# Patient Record
Sex: Female | Born: 1940 | Race: White | Hispanic: No | Marital: Married | Smoking: Never smoker
Health system: Southern US, Community
[De-identification: ages and names within clinical notes are randomized; demographics above are authoritative.]

## PROBLEM LIST (undated history)

## (undated) DIAGNOSIS — I87309 Chronic venous hypertension (idiopathic) without complications of unspecified lower extremity: Secondary | ICD-10-CM

## (undated) DIAGNOSIS — Z9889 Other specified postprocedural states: Secondary | ICD-10-CM

## (undated) DIAGNOSIS — T8859XA Other complications of anesthesia, initial encounter: Secondary | ICD-10-CM

## (undated) DIAGNOSIS — G479 Sleep disorder, unspecified: Secondary | ICD-10-CM

## (undated) DIAGNOSIS — E785 Hyperlipidemia, unspecified: Secondary | ICD-10-CM

## (undated) DIAGNOSIS — M1990 Unspecified osteoarthritis, unspecified site: Secondary | ICD-10-CM

## (undated) DIAGNOSIS — M797 Fibromyalgia: Secondary | ICD-10-CM

## (undated) DIAGNOSIS — T4145XA Adverse effect of unspecified anesthetic, initial encounter: Secondary | ICD-10-CM

## (undated) DIAGNOSIS — C801 Malignant (primary) neoplasm, unspecified: Secondary | ICD-10-CM

## (undated) DIAGNOSIS — I739 Peripheral vascular disease, unspecified: Secondary | ICD-10-CM

## (undated) DIAGNOSIS — R7303 Prediabetes: Secondary | ICD-10-CM

## (undated) DIAGNOSIS — K219 Gastro-esophageal reflux disease without esophagitis: Secondary | ICD-10-CM

## (undated) DIAGNOSIS — K589 Irritable bowel syndrome without diarrhea: Secondary | ICD-10-CM

## (undated) DIAGNOSIS — Z9289 Personal history of other medical treatment: Secondary | ICD-10-CM

## (undated) DIAGNOSIS — R112 Nausea with vomiting, unspecified: Secondary | ICD-10-CM

## (undated) HISTORY — DX: Irritable bowel syndrome, unspecified: K58.9

## (undated) HISTORY — PX: CHOLECYSTECTOMY: SHX55

## (undated) HISTORY — PX: ENDOVENOUS ABLATION SAPHENOUS VEIN W/ LASER: SUR449

## (undated) HISTORY — DX: Chronic venous hypertension (idiopathic) without complications of unspecified lower extremity: I87.309

## (undated) HISTORY — PX: HERNIA REPAIR: SHX51

## (undated) HISTORY — PX: ABDOMINAL HYSTERECTOMY: SHX81

## (undated) HISTORY — PX: OTHER SURGICAL HISTORY: SHX169

## (undated) HISTORY — PX: SHOULDER ARTHROSCOPY: SHX128

## (undated) HISTORY — DX: Prediabetes: R73.03

## (undated) HISTORY — PX: COLONOSCOPY: SHX174

## (undated) HISTORY — PX: TONSILLECTOMY: SUR1361

## (undated) HISTORY — DX: Hyperlipidemia, unspecified: E78.5

## (undated) HISTORY — PX: BREAST ENHANCEMENT SURGERY: SHX7

## (undated) HISTORY — DX: Gastro-esophageal reflux disease without esophagitis: K21.9

---

## 2006-03-26 HISTORY — PX: BLADDER SUSPENSION: SHX72

## 2008-04-16 ENCOUNTER — Emergency Department (HOSPITAL_BASED_OUTPATIENT_CLINIC_OR_DEPARTMENT_OTHER): Admission: EM | Admit: 2008-04-16 | Discharge: 2008-04-16 | Payer: Self-pay | Admitting: Emergency Medicine

## 2008-04-16 ENCOUNTER — Ambulatory Visit: Payer: Self-pay | Admitting: Diagnostic Radiology

## 2008-04-17 ENCOUNTER — Emergency Department (HOSPITAL_BASED_OUTPATIENT_CLINIC_OR_DEPARTMENT_OTHER): Admission: EM | Admit: 2008-04-17 | Discharge: 2008-04-17 | Payer: Self-pay | Admitting: Emergency Medicine

## 2008-06-01 ENCOUNTER — Encounter: Admission: RE | Admit: 2008-06-01 | Discharge: 2008-08-16 | Payer: Self-pay | Admitting: Orthopedic Surgery

## 2010-09-06 ENCOUNTER — Encounter (INDEPENDENT_AMBULATORY_CARE_PROVIDER_SITE_OTHER): Payer: Medicare Other

## 2010-09-06 ENCOUNTER — Encounter (INDEPENDENT_AMBULATORY_CARE_PROVIDER_SITE_OTHER): Payer: Medicare Other | Admitting: Vascular Surgery

## 2010-09-06 DIAGNOSIS — I83893 Varicose veins of bilateral lower extremities with other complications: Secondary | ICD-10-CM

## 2010-09-06 DIAGNOSIS — M7989 Other specified soft tissue disorders: Secondary | ICD-10-CM

## 2010-09-06 NOTE — Consult Note (Signed)
NEW PATIENT CONSULTATION  Hawkins, Dominique B DOB:  Jan 19, 1941                                       09/06/2010 KXFGH#:[id number]  Patient presents today for discussion regarding bilateral extremity venous hypertension.  She is an active healthy 70 year old white female with bilateral difficulty regarding lower extremity venous pathology. 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  She also has difficulty in her left leg with varices over her pretibial area and also has had multiple episodes of bleeding from telangiectasia over her left lateral calf above the ankle.  She does not have any history of DVT.  She does not have a great deal of pain over the varicosities in her left leg.  PAST MEDICAL HISTORY:  Significant for excellent health but no cardiac difficulty.  PREVIOUS SURGERY:  Cholecystectomy, partial hysterectomy.  SOCIAL HISTORY:  She is married with 2 children.  She does not smoke or drink alcohol.  FAMILY HISTORY:  Negative for premature atherosclerotic disease.  REVIEW OF SYSTEMS:  No weight loss or gain.  She weighs 154 pounds.  She is 5 feet 2 inches tall. VASCULAR:  Positive for pain and is described with walking. GI:  For irritable bowel syndrome and reflux, otherwise review of systems is negative.  PHYSICAL EXAMINATION:  A well-developed and well-nourished white female appearing stated age in no acute distress.  Blood pressure is 168/84, pulse 72, respirations 16-18.  HEENT is normal.  She has 2+ radial and 2+ dorsalis pedis pulses bilaterally.  Musculoskeletal shows no major deformities or cyanosis.  Neurologic:  No focal weakness or paresthesias.  Skin without ulcers or rashes.  She does have very prominent telangiectasia and this large plexus of the medial knee on  the right and also prominent spider veins over the left lateral area above her ankle.  She underwent noninvasive vascular laboratory studies in our office, and this revealed reflux in her saphenous veins bilaterally.  She does not have any significant reflux in her deep veins.  I have discussed the significance of this with patient.  I explained that this is not life- or limb-threatening, and it could be treated conservatively.  She has not worn compression garments; therefore, we have fitted her today with thigh-high graduated compression garments, 20 mmHg to 30 mmHg, and have discussed the use of these.  We will see her again in 3 months for a continued discussion.  I do feel that she would be a candidate for improvement of her symptoms should conservative therapy fail on the right.  She would be a good candidate for laser ablation of her saphenous vein and sclerotherapy of these painful telangiectasia on the left.  I would recommend initially treating with injection of the telangiectasia only to hopefully prevent recurrent bleeding from these areas.  She understands and will see [place] again in 3 months for a continued discussion.    Larina Earthly, M.D. Electronically Signed  TFE/MEDQ  D:  09/06/2010  T:  09/06/2010  Job:  [id number]  cc:   Raynelle Jan, M.D.

## 2010-09-11 NOTE — Procedures (Unsigned)
LOWER EXTREMITY VENOUS REFLUX EXAM  INDICATION:  Varicose veins.  EXAM:  Using color-flow imaging and pulse Doppler spectral analysis, the bilateral common femoral, superficial femoral, popliteal, posterior tibial, greater and lesser saphenous veins are evaluated.  There is evidence of deep venous reflux noted focally in the right common femoral vein.  The bilateral saphenofemoral junctions are competent.  The bilateral nontortuous GSV's demonstrate reflux of >576milliseconds.  The bilateral proximal short saphenous vein demonstrates competency.  GSV Diameter (used if found to be incompetent only)                                           Right    Left Proximal Greater Saphenous Vein           0.41 cm  0.52 cm Proximal-to-mid-thigh                     cm       cm Mid thigh                                 0.38 cm  0.37 cm Mid-distal thigh                          cm       cm Distal thigh                              0.42 cm  0.33 cm Knee                                      0.45 cm  0.32 cm  IMPRESSION: 1. Bilateral greater saphenous vein and right common femoral vein     reflux is noted, as described above. 2. The bilateral small saphenous veins are competent.           ___________________________________________ Larina Earthly, M.D.  CH/MEDQ  D:  09/06/2010  T:  09/06/2010  Job:  [id number]

## 2010-11-14 ENCOUNTER — Encounter: Payer: Self-pay | Admitting: Vascular Surgery

## 2010-11-16 ENCOUNTER — Encounter: Payer: Self-pay | Admitting: Vascular Surgery

## 2010-12-12 ENCOUNTER — Encounter: Payer: Self-pay | Admitting: Vascular Surgery

## 2010-12-13 ENCOUNTER — Ambulatory Visit (INDEPENDENT_AMBULATORY_CARE_PROVIDER_SITE_OTHER): Payer: Medicare Other | Admitting: Vascular Surgery

## 2010-12-13 ENCOUNTER — Encounter: Payer: Self-pay | Admitting: Vascular Surgery

## 2010-12-13 VITALS — BP 115/70 | HR 73 | Resp 16 | Ht 62.0 in | Wt 151.0 lb

## 2010-12-13 DIAGNOSIS — I83893 Varicose veins of bilateral lower extremities with other complications: Secondary | ICD-10-CM

## 2010-12-13 NOTE — Progress Notes (Signed)
Problems with Activities of Daily Living Secondary to Leg Pain  1. Dominique Hawkins states she has had to discontinue walking for exercise due to leg pain.  2. Dominique Hawkins states that any activities that require prolonged standing such as cooking, cleaning, and shopping are very difficult due to leg pain.  Rankin, [name]   Failure of  Conservative Therapy:  1. Worn 20-30 mm Hg thigh high compression hose >3 months with no relief of symptoms.  2. Frequently elevates legs-no relief of symptoms  3. Taken Ibuprofen 600 Mg TID with no relief of symptoms.  The patient tends to have pain bilaterally more so on the right leg than on the left despite graduated compression garments. She has large nest of raised telangiectasia of the medial aspect of her right knee and reports discomfort this with prolonged standing and also lying in bed with one detaching the other. I have discussed treatment to include right great saphenous vein ablation and sclerotherapy of the painful telangiectasia. She understands this and wished to proceed says she can be on the schedule.

## 2010-12-21 ENCOUNTER — Other Ambulatory Visit: Payer: Self-pay | Admitting: *Deleted

## 2010-12-21 DIAGNOSIS — I83893 Varicose veins of bilateral lower extremities with other complications: Secondary | ICD-10-CM

## 2011-02-07 ENCOUNTER — Encounter: Payer: Self-pay | Admitting: Vascular Surgery

## 2011-02-08 ENCOUNTER — Ambulatory Visit (INDEPENDENT_AMBULATORY_CARE_PROVIDER_SITE_OTHER): Payer: Medicare Other | Admitting: Vascular Surgery

## 2011-02-08 ENCOUNTER — Encounter: Payer: Self-pay | Admitting: Vascular Surgery

## 2011-02-08 VITALS — BP 128/65 | HR 78 | Resp 18 | Ht 62.0 in | Wt 153.0 lb

## 2011-02-08 DIAGNOSIS — I83893 Varicose veins of bilateral lower extremities with other complications: Secondary | ICD-10-CM

## 2011-02-08 NOTE — Progress Notes (Signed)
Laser Ablation Procedure      Date: 02/08/2011    Dominique Hawkins DOB:06/18/1940  Consent signed: Yes  Surgeon:T.F. Early  Procedure: Laser Ablation: right Greater Saphenous Vein  BP 128/65  Pulse 78  Resp 18  Ht 5\' 2"  (1.575 m)  Wt 153 lb (69.4 kg)  BMI 27.98 kg/m2  Start time: 10:40AM   End time: 11:55AM  Tumescent Anesthesia: 425 cc 0.9% NaCl with 50 cc Lidocaine HCL with 1% Epi and 15 cc 8.4% NaHCO3  Local Anesthesia: 4 cc Lidocaine HCL and NaHCO3 (ratio 2:1)  Continuous Mode: 15 watts [employer] 1737 joules Total Time1:55   Sclerotherapy: 0.3 %Sotradecol. Patient received a total of 3 cc    Patient tolerated procedure well: Yes  Rankin, [name]  Description of Procedure:  After marking the course of the saphenous vein and the secondary varicosities in the standing position, the patient was placed on the operating table in the supine position, and the right leg was prepped and draped in sterile fashion. Local anesthetic was administered, and under ultrasound guidance the saphenous vein was accessed with a micro needle and guide wire; then the micro puncture sheath was placed. A guide wire was inserted to the saphenofemoral junction, followed by a 5 french sheath.  The position of the sheath and then the laser fiber below the junction was confirmed using the ultrasound and visualization of the aiming beam.  Tumescent anesthesia was administered along the course of the saphenous vein using ultrasound guidance. Protective laser glasses were placed on the patient, and the laser was fired at at 15 watt continuous mode.  For a total of 1737 joules.  A steri strip was applied to the puncture site.   Sclerotherapy was performed to 4 perforator vessels using 3  cc .3% Sotradecol foam via a 30 gauge needle.  ABD pads and thigh high compression stockings were applied.  Ace wrap bandages were appliedat the top of the saphenofemoral junction.  Blood loss was less than 15 cc.  The  patient ambulated out of the operating room having tolerated the procedure well.  The patient had uneventful laser ablation of her right great saphenous vein. She also had sclerotherapy to prominent telangiectasia in the right medial knee and left ankle area where she had prior bleeding. She will be seen again in one week for followup.

## 2011-02-09 ENCOUNTER — Encounter: Payer: Self-pay | Admitting: Vascular Surgery

## 2011-02-12 ENCOUNTER — Ambulatory Visit: Payer: Medicare Other | Admitting: Vascular Surgery

## 2011-02-12 ENCOUNTER — Telehealth: Payer: Self-pay | Admitting: *Deleted

## 2011-02-12 ENCOUNTER — Encounter: Payer: Self-pay | Admitting: Vascular Surgery

## 2011-02-12 ENCOUNTER — Other Ambulatory Visit (INDEPENDENT_AMBULATORY_CARE_PROVIDER_SITE_OTHER): Payer: Medicare Other | Admitting: *Deleted

## 2011-02-12 VITALS — BP 99/75 | HR 74 | Resp 18 | Ht 62.0 in | Wt 153.0 lb

## 2011-02-12 DIAGNOSIS — Z48812 Encounter for surgical aftercare following surgery on the circulatory system: Secondary | ICD-10-CM

## 2011-02-12 DIAGNOSIS — I83893 Varicose veins of bilateral lower extremities with other complications: Secondary | ICD-10-CM

## 2011-02-12 DIAGNOSIS — I8310 Varicose veins of unspecified lower extremity with inflammation: Secondary | ICD-10-CM

## 2011-02-12 NOTE — Progress Notes (Signed)
Patient came for her one week fu duplex which showed closure of her right GSV. Dr. Hart Rochester unable to see patient due to a personal emergency. Patient understood and accepted seeing me instead. I scheduled her for her remaining sclerotherapy treatment on 03/06/11 and Dr. Arbie Cookey will see her to check her progress before I treat her. Instructed patient to continue wearing her compression stockings for one more week and told her to discontinue the Ibuprofen 600 mg TID. Told her to take 400mg  if she ever felt like she needed pain relief.

## 2011-02-12 NOTE — Telephone Encounter (Signed)
02/12/2011  Time: 3:13 PM   Patient Name: Dominique Hawkins  Patient of: T.F. Early  Procedure:Laser Ablation right  Greater saphenous vein and sclerotherapy right and left legs  02-08-2011  Reached patient at home and checked  Her status  Yes    Comments/Actions Taken: no complaints of pain or swelling.  Reminded Mrs. Durell of post procedural instructions.  Reminded Mrs. Funderburke of ultrasound and follow up appointment on 02-12-2011.      @SIGNATURE @Rankin , [name]

## 2011-02-20 NOTE — Procedures (Unsigned)
DUPLEX DEEP VENOUS EXAM - LOWER EXTREMITY  INDICATION:  Followup right great saphenous vein ablation  HISTORY:  Edema:  No Trauma/Surgery:  Right great saphenous ablation 02/08/2011 Pain:  No PE:  No Previous DVT:  No Anticoagulants:  No Other:  DUPLEX EXAM:               CFV   SFV   PopV  PTV    GSV               R  L  R  L  R  L  R   L  R  L Thrombosis    o  o  o     o     o      + Spontaneous   +  +  +     +     +      o Phasic        +  +  +     +     +      o Augmentation  +  +  +     +     +      o Compressible  +  +  +     +     +      o Competent     o  +  +     o     +      +  Legend:  + - yes  o - no  p - partial  D - decreased  IMPRESSION: 1. The right great saphenous vein appears thrombosed from the distal     insertion site to the saphenofemoral junction. 2. No evidence of right lower extremity deep venous thrombosis. 3. There is evidence of deep vein insufficiency at the common femoral     vein and the popliteal vein.       _____________________________ Quita Skye. Hart Rochester, M.D.  EM/MEDQ  D:  02/12/2011  T:  02/12/2011  Job:  [id number]

## 2011-02-26 ENCOUNTER — Encounter: Payer: Self-pay | Admitting: Vascular Surgery

## 2011-02-27 ENCOUNTER — Encounter: Payer: Self-pay | Admitting: Vascular Surgery

## 2011-02-27 ENCOUNTER — Ambulatory Visit (INDEPENDENT_AMBULATORY_CARE_PROVIDER_SITE_OTHER): Payer: Medicare Other | Admitting: Vascular Surgery

## 2011-02-27 VITALS — BP 120/78 | HR 80 | Resp 16 | Ht 62.0 in | Wt 154.4 lb

## 2011-02-27 DIAGNOSIS — I83893 Varicose veins of bilateral lower extremities with other complications: Secondary | ICD-10-CM

## 2011-02-27 NOTE — Progress Notes (Signed)
Patient presents today for evaluation of erythema and thigh. She had undergone successful ablation of her great saphenous vein and this leg on 11:15. Repeat duplex revealed thrombosis of her saphenous vein as expected with good treatment. She has some persistent erythema and warmth mouth 2 half weeks status post procedure appears here for further discussion. He has no significant swelling.  Past Medical History  Diagnosis Date  . Venous hypertension     varicose veins  . Hyperlipidemia   . GERD (gastroesophageal reflux disease)   . Osteoporosis   . Prediabetes   . IBS (irritable bowel syndrome)     History  Substance Use Topics  . Smoking status: Never Smoker   . Smokeless tobacco: Never Used  . Alcohol Use: No    Family History  Problem Relation Age of Onset  . Mental illness Mother   . Cancer Mother   . Diabetes Father   . Cancer Father   . Diabetes Brother     Allergies  Allergen Reactions  . Codeine Nausea And Vomiting    Current outpatient prescriptions:aspirin 325 MG tablet, Take 325 mg by mouth daily.  , Disp: , Rfl: ;  cholecalciferol (VITAMIN D) 1000 UNITS tablet, Take 1,000 Units by mouth daily.  , Disp: , Rfl: ;  colesevelam (WELCHOL) 625 MG tablet, Take 1,875 mg by mouth 2 (two) times daily with a meal.  , Disp: , Rfl: ;  conjugated estrogens (PREMARIN) vaginal cream, Place vaginally daily.  , Disp: , Rfl:  fish oil-omega-3 fatty acids 1000 MG capsule, Take 2 g by mouth daily.  , Disp: , Rfl: ;  Multiple Vitamin (MULTIVITAMIN) tablet, Take 1 tablet by mouth daily.  , Disp: , Rfl: ;  pantoprazole (PROTONIX) 40 MG tablet, Take 40 mg by mouth daily as needed. , Disp: , Rfl:   BP 120/78  Pulse 80  Resp 16  Ht 5\' 2"  (1.575 m)  Wt 154 lb 6.4 oz (70.035 kg)  BMI 28.24 kg/m2  Body mass index is 28.24 kg/(m^2).       Review of systems no change. Physical exam well-developed well-nourished white female no acute stress her at dorsalis pedis pulses are 2+  bilaterally she has thickening of the area of her great saphenous vein which is successfully ablated. She also has good result from sclerotherapy to telangiectasia at her calf bilaterally. Discuss this at length with Ms. Nier appeared. I explained this is a normal resolution as expected after ablation of her saphenous vein and she resolves the thrombus that we have calls to. She was reassured he'll see [place] again on an as-needed basis.

## 2011-03-06 ENCOUNTER — Ambulatory Visit: Payer: Medicare Other | Admitting: Vascular Surgery

## 2011-03-06 ENCOUNTER — Ambulatory Visit (INDEPENDENT_AMBULATORY_CARE_PROVIDER_SITE_OTHER): Payer: Medicare Other | Admitting: *Deleted

## 2011-03-06 DIAGNOSIS — I83893 Varicose veins of bilateral lower extremities with other complications: Secondary | ICD-10-CM | POA: Insufficient documentation

## 2011-03-06 NOTE — Progress Notes (Signed)
X=.3% Sotradecol administered with a 27g butterfly.  Patient received a total of 7cc.   Photos: archived  Compression stockings applied: yes  Second insurance approved sclerotherapy treatment. Treated any spiders and reticulars that remained open since first treatment. Patient "hates needles". She jumped a bit when injected but tolerated the treatment well. Excellent coverage obtained. Will follow as needed.

## 2011-03-06 NOTE — Progress Notes (Signed)
Patient ID: Dominique Hawkins, female   DOB: 21-Jun-1940, 70 y.o.   MRN: [id number]

## 2011-03-08 ENCOUNTER — Encounter: Payer: Self-pay | Admitting: Vascular Surgery

## 2011-03-09 ENCOUNTER — Encounter: Payer: Self-pay | Admitting: Vascular Surgery

## 2013-09-05 ENCOUNTER — Emergency Department (HOSPITAL_COMMUNITY)
Admission: EM | Admit: 2013-09-05 | Discharge: 2013-09-06 | Disposition: A | Discharge status: Home / Self Care | Payer: Medicare Other | Attending: Emergency Medicine | Admitting: Emergency Medicine

## 2013-09-05 ENCOUNTER — Encounter (HOSPITAL_COMMUNITY): Payer: Self-pay | Admitting: Emergency Medicine

## 2013-09-05 DIAGNOSIS — Z791 Long term (current) use of non-steroidal anti-inflammatories (NSAID): Secondary | ICD-10-CM | POA: Insufficient documentation

## 2013-09-05 DIAGNOSIS — E785 Hyperlipidemia, unspecified: Secondary | ICD-10-CM | POA: Insufficient documentation

## 2013-09-05 DIAGNOSIS — Z79899 Other long term (current) drug therapy: Secondary | ICD-10-CM | POA: Insufficient documentation

## 2013-09-05 DIAGNOSIS — E119 Type 2 diabetes mellitus without complications: Secondary | ICD-10-CM | POA: Insufficient documentation

## 2013-09-05 DIAGNOSIS — K219 Gastro-esophageal reflux disease without esophagitis: Secondary | ICD-10-CM | POA: Insufficient documentation

## 2013-09-05 DIAGNOSIS — M25559 Pain in unspecified hip: Secondary | ICD-10-CM | POA: Insufficient documentation

## 2013-09-05 DIAGNOSIS — M25552 Pain in left hip: Secondary | ICD-10-CM

## 2013-09-05 DIAGNOSIS — I10 Essential (primary) hypertension: Secondary | ICD-10-CM | POA: Insufficient documentation

## 2013-09-05 NOTE — ED Provider Notes (Signed)
CSN: [id number]     Arrival date & time 09/05/13  2046 History  This chart was scribed for Antonietta Breach, PA-C, non-physician practitioner working with Elyn Peers, MD by Vernell Barrier, ED scribe. This patient was seen in room WA20/WA20 and the patient's care was started at 11:49 PM.    Chief Complaint  Patient presents with  . Hip Pain   The history is provided by the patient. No language interpreter was used.   HPI Comments: Dominique Hawkins is a 73 y.o. female w/ hx of osteoporosis presents to the Emergency Department complaining of constant, throbbing left hip pain that radiates down the left leg; initial onset 4 months ago, gradually worsened over the past month. Pain sharp with movement. Going from seated to standing makes pain worse. Hx of back pain that has not worsened or increased since hip pain began. Referred by PCP to [facility] where she was seen by Dr. Gladstone Lighter. States he injected a cortisone shot into the left hip which provided no relief per patient. MRI was also performed at this visit that Dr. Gladstone Lighter told her showed bursitis and arthritis. States he also told her she needed to be walking with a cane or walker; pt has not been compliant with this advice. Says her PCP looked at the same MRI results and told her she also had a fracture. Has since been released by Dr. Gladstone Lighter; seeking further care with Dr. Wynelle Link but is unable to get an appointment with him until September. Currently treating left hip pain with Tylenol 3 w/ codeine ; has taken 3 since receiving prescription 3 days ago. Last taken this evening but states this is not working. Pt is allergic to Codeine. Otherwise takes Aleve during the day as needed which she also states does not work to relieve pain Denies loss of sensation in lower extremities or loss of bowel/bladder function.    Past Medical History  Diagnosis Date  . Venous hypertension     varicose veins  . Hyperlipidemia   . GERD (gastroesophageal  reflux disease)   . Osteoporosis   . Prediabetes   . IBS (irritable bowel syndrome)    Past Surgical History  Procedure Laterality Date  . Cholecystectomy    . Abdominal hysterectomy    . Breast enhancement surgery    . Btl    . Tonsillectomy    . Endovenous ablation saphenous vein w/ laser  02-08-2011  right greater saphenous vein    and sclerotherapy  right and left legs   Family History  Problem Relation Age of Onset  . Mental illness Mother   . Cancer Mother   . Diabetes Father   . Cancer Father   . Diabetes Brother    History  Substance Use Topics  . Smoking status: Never Smoker   . Smokeless tobacco: Never Used  . Alcohol Use: No   OB History   Grav Para Term Preterm Abortions TAB SAB Ect Mult Living                  Review of Systems  Musculoskeletal: Positive for arthralgias and back pain.  All other systems reviewed and are negative.   Allergies  Codeine  Home Medications   Prior to Admission medications   Medication Sig Start Date End Date Taking? Authorizing Provider  acetaminophen-codeine (TYLENOL #3) 300-30 MG per tablet Take 1 tablet by mouth every 6 (six) hours. For pain. 09/02/13  Yes Historical Provider, MD  cholecalciferol (VITAMIN D) 1000 UNITS  tablet Take 2,000 Units by mouth daily.    Yes Historical Provider, MD  colesevelam (WELCHOL) 625 MG tablet Take 1,250 mg by mouth daily.    Yes Historical Provider, MD  fish oil-omega-3 fatty acids 1000 MG capsule Take 2 g by mouth daily.     Yes Historical Provider, MD  naproxen sodium (ANAPROX) 220 MG tablet Take 220 mg by mouth as needed (for pain.).   Yes Historical Provider, MD  pantoprazole (PROTONIX) 40 MG tablet Take 40 mg by mouth daily as needed (for acid reflux).    Yes Historical Provider, MD  oxyCODONE-acetaminophen (PERCOCET/ROXICET) 5-325 MG per tablet Take 1 tablet by mouth every 4 (four) hours as needed for moderate pain or severe pain. 09/06/13   Antonietta Breach, PA-C   Triage vitals: BP  133/78  Pulse 69  Temp(Src) 97.6 F (36.4 C) (Oral)  Resp 16  Ht 5\' 2"  (1.575 m)  Wt 153 lb (69.4 kg)  BMI 27.98 kg/m2  SpO2 99%  Physical Exam  Nursing note and vitals reviewed. Constitutional: She is oriented to person, place, and time. She appears well-developed and well-nourished. No distress.  Nontoxic/nonseptic appearing  HENT:  Head: Normocephalic and atraumatic.  Eyes: Conjunctivae and EOM are normal. No scleral icterus.  Neck: Normal range of motion.  Cardiovascular: Normal rate, regular rhythm and intact distal pulses.   DP and PT pulses 2+ bilaterally.  Pulmonary/Chest: Effort normal. No respiratory distress.  Musculoskeletal: Normal range of motion. She exhibits tenderness.       Left hip: She exhibits tenderness. She exhibits normal range of motion, no swelling, no crepitus and no deformity.  Normal passive ROM of L hip joint. TTP of anterior L hip in inguinal region. Mild bony TTP without crepitus or deformity. No swelling, effusion, erythema, or heat to touch. No leg shortening or malrotation.  Neurological: She is alert and oriented to person, place, and time.  Patellar and Achilles reflexes 2+ bilaterally. No gross sensory deficits appreciated. Patient weight bears without assistance. She ambulates with antalgic gait; I personally assisted patient while ambulating as she is supposed to be using a cane or walker at baseline.  Skin: Skin is warm and dry. No rash noted. She is not diaphoretic. No erythema. No pallor.  Psychiatric: She has a normal mood and affect. Her behavior is normal.    ED Course  Procedures (including critical care time) DIAGNOSTIC STUDIES: Oxygen Saturation is 96% on room air, normal by my interpretation.    COORDINATION OF CARE: At 12:01 AM: Discussed treatment plan with patient which includes x-ray of the left hip. Patient agrees.   Labs Review Labs Reviewed - No data to display  Imaging Review Dg Hip Complete Left  09/06/2013    CLINICAL DATA:  Generalized left hip pain for several months.  EXAM: LEFT HIP - COMPLETE 2+ VIEW  COMPARISON:  Left hip radiographs performed 04/27/2013  FINDINGS: There is no evidence of fracture or dislocation. Both femoral heads are seated normally within their respective acetabula. The proximal left femur appears intact. No significant degenerative change is appreciated. The sacroiliac joints are unremarkable in appearance.  The visualized bowel gas pattern is grossly unremarkable in appearance. Scattered phleboliths are noted within the pelvis.  IMPRESSION: No evidence of fracture or dislocation.   Electronically Signed   By: Garald Balding M.D.   On: 09/06/2013 01:03     EKG Interpretation None      MDM   Final diagnoses:  Hip pain, left  73 year old female presents to the emergency department for left hip pain. Patient has had hip pain for the last 4 months. Patient has been followed by [facility] orthopedics for this issue. She has seen Dr. Gladstone Lighter, but has been released from his care as she is awaiting an appointment with Dr. Maureen Ralphs. Patient had MRI completed 6 weeks ago by [facility]. She was told by Dr. Gladstone Lighter that MR showed degenerative changes which were causing her hip pain. Dr. Gladstone Lighter recommended ambulating with cane or walker which patient has not been compliant with.   When pain persisted and began to be managed by her PCP, patient states her PCP told her that her MRI revealed a fracture of her R hip. Patient denies any trauma or injury to her L hip to suggest fracture mechanism. Patient does have osteoporosis, but no other hx which would make her high risk for pathologic fracture. Of note, patient has been ambulatory x 4 months since onset of symptoms. She is able to weight bear and ambulate in the ED today.  Patient is neurovascularly intact in physical exam. No gross sensory deficits. No leg shortening or malrotation. X-ray ordered which shows no evidence of fracture or  dislocation of left hip. Patient's pain well controlled in ED with Toradol and Percocet. Given history and workup today, do not believe further emergent care is indicated. Will manage patient's pain with Percocet and have strongly advised patient followup with [facility] even if it means she followup with a different provider before seeing Dr. Maureen Ralphs in September. I also recommended she call [facility] to discuss fracture in question, though my suspicion for fracture is low. Have also stressed the use of a cane or walker when ambulating as she has been noncompliant with this. Crutches offered which patient declines. Return precautions discussed and provided. Patient agreeable to plan with no unaddressed concerns.  I personally performed the services described in this documentation, which was scribed in my presence. The recorded information has been reviewed and is accurate.    Filed Vitals:   09/05/13 2113 09/05/13 2342  BP: 147/79 133/78  Pulse: 68 69  Temp: 98.3 F (36.8 C) 97.6 F (36.4 C)  TempSrc: Oral Oral  Resp: 18 16  Height: 5\' 2"  (1.575 m)   Weight: 153 lb (69.4 kg)   SpO2: 96% 99%     Antonietta Breach, PA-C 09/06/13 1840

## 2013-09-05 NOTE — ED Notes (Signed)
Pt arrived ot the ED with a complaint of left hip pain.  Pt states the pain has been present for over a month.  Pt has had an MRI and was told she has a fracture and needs a hip replacement.  She was only able to see Dr. Maureen Ralphs in September.  Pain has been so great she can't stand it.

## 2013-09-06 ENCOUNTER — Emergency Department (HOSPITAL_COMMUNITY): Payer: Medicare Other

## 2013-09-06 MED ORDER — ONDANSETRON 8 MG PO TBDP
8.0000 mg | ORAL_TABLET | Freq: Once | ORAL | Status: AC | PRN
  Administered 2013-09-06: 8 mg via ORAL
  Filled 2013-09-06: qty 1

## 2013-09-06 MED ORDER — OXYCODONE-ACETAMINOPHEN 5-325 MG PO TABS
2.0000 | ORAL_TABLET | Freq: Once | ORAL | Status: AC
  Administered 2013-09-06: 2 via ORAL
  Filled 2013-09-06: qty 2

## 2013-09-06 MED ORDER — KETOROLAC TROMETHAMINE 60 MG/2ML IM SOLN
60.0000 mg | Freq: Once | INTRAMUSCULAR | Status: AC
  Administered 2013-09-06: 60 mg via INTRAMUSCULAR
  Filled 2013-09-06: qty 2

## 2013-09-06 MED ORDER — OXYCODONE-ACETAMINOPHEN 5-325 MG PO TABS: 1.0000 | ORAL_TABLET | ORAL | Status: DC | PRN

## 2013-09-06 NOTE — Discharge Instructions (Signed)
Arthralgia  Your caregiver has diagnosed you as suffering from an arthralgia. Arthralgia means there is pain in a joint. This can come from many reasons including:  · Bruising the joint which causes soreness (inflammation) in the joint.  · Wear and tear on the joints which occur as we grow older (osteoarthritis).  · Overusing the joint.  · Various forms of arthritis.  · Infections of the joint.  Regardless of the cause of pain in your joint, most of these different pains respond to anti-inflammatory drugs and rest. The exception to this is when a joint is infected, and these cases are treated with antibiotics, if it is a bacterial infection.  HOME CARE INSTRUCTIONS   · Rest the injured area for as long as directed by your caregiver. Then slowly start using the joint as directed by your caregiver and as the pain allows. Crutches as directed may be useful if the ankles, knees or hips are involved. If the knee was splinted or casted, continue use and care as directed. If an stretchy or elastic wrapping bandage has been applied today, it should be removed and re-applied every 3 to 4 hours. It should not be applied tightly, but firmly enough to keep swelling down. Watch toes and feet for swelling, bluish discoloration, coldness, numbness or excessive pain. If any of these problems (symptoms) occur, remove the ace bandage and re-apply more loosely. If these symptoms persist, contact your caregiver or return to this location.  · For the first 24 hours, keep the injured extremity elevated on pillows while lying down.  · Apply ice for 15-20 minutes to the sore joint every couple hours while awake for the first half day. Then 03-04 times per day for the first 48 hours. Put the ice in a plastic bag and place a towel between the bag of ice and your skin.  · Wear any splinting, casting, elastic bandage applications, or slings as instructed.  · Only take over-the-counter or prescription medicines for pain, discomfort, or fever as  directed by your caregiver. Do not use aspirin immediately after the injury unless instructed by your physician. Aspirin can cause increased bleeding and bruising of the tissues.  · If you were given crutches, continue to use them as instructed and do not resume weight bearing on the sore joint until instructed.  Persistent pain and inability to use the sore joint as directed for more than 2 to 3 days are warning signs indicating that you should see a caregiver for a follow-up visit as soon as possible. Initially, a hairline fracture (break in bone) may not be evident on X-rays. Persistent pain and swelling indicate that further evaluation, non-weight bearing or use of the joint (use of crutches or slings as instructed), or further X-rays are indicated. X-rays may sometimes not show a small fracture until a week or 10 days later. Make a follow-up appointment with your own caregiver or one to whom we have referred you. A radiologist (specialist in reading X-rays) may read your X-rays. Make sure you know how you are to obtain your X-ray results. Do not assume everything is normal if you do not hear from us.  SEEK MEDICAL CARE IF:  Bruising, swelling, or pain increases.  SEEK IMMEDIATE MEDICAL CARE IF:   · Your fingers or toes are numb or blue.  · The pain is not responding to medications and continues to stay the same or get worse.  · The pain in your joint becomes severe.  · You   develop a fever over 102° F (38.9° C).  · It becomes impossible to move or use the joint.  MAKE SURE YOU:   · Understand these instructions.  · Will watch your condition.  · Will get help right away if you are not doing well or get worse.  Document Released: 03/12/2005 Document Revised: 06/04/2011 Document Reviewed: 10/29/2007  Exit[employer]® Patient Information ©2014 [employer].

## 2013-09-11 NOTE — ED Provider Notes (Signed)
Medical screening examination/treatment/procedure(s) were performed by non-physician practitioner and as supervising physician I was immediately available for consultation/collaboration.   EKG Interpretation None        Elyn Peers, MD 09/11/13 [phone]

## 2013-12-22 ENCOUNTER — Other Ambulatory Visit: Payer: Self-pay | Admitting: Orthopedic Surgery

## 2013-12-22 NOTE — Progress Notes (Signed)
Preoperative surgical orders have been place into the Epic hospital system for Dominique Hawkins on 12/22/2013, 12:05 PM  by Mickel Crow for surgery on 01/18/2014.  Preop Total Hip - Anterior Approach orders including Experel Injecion, IV Tylenol, and IV Decadron as long as there are no contraindications to the above medications. Arlee Muslim, PA-C

## 2014-01-08 ENCOUNTER — Encounter (HOSPITAL_COMMUNITY): Payer: Self-pay | Admitting: Pharmacy Technician

## 2014-01-14 ENCOUNTER — Encounter (INDEPENDENT_AMBULATORY_CARE_PROVIDER_SITE_OTHER): Payer: Self-pay

## 2014-01-14 ENCOUNTER — Encounter (HOSPITAL_COMMUNITY)
Admission: RE | Admit: 2014-01-14 | Discharge: 2014-01-14 | Disposition: A | Discharge status: Home / Self Care | Payer: Medicare Other | Source: Ambulatory Visit | Attending: Orthopedic Surgery | Admitting: Orthopedic Surgery

## 2014-01-14 ENCOUNTER — Encounter (HOSPITAL_COMMUNITY): Payer: Self-pay

## 2014-01-14 ENCOUNTER — Ambulatory Visit (HOSPITAL_COMMUNITY)
Admission: RE | Admit: 2014-01-14 | Discharge: 2014-01-14 | Disposition: A | Discharge status: Home / Self Care | Payer: Medicare Other | Source: Ambulatory Visit | Attending: Orthopedic Surgery | Admitting: Orthopedic Surgery

## 2014-01-14 DIAGNOSIS — Z01818 Encounter for other preprocedural examination: Secondary | ICD-10-CM | POA: Diagnosis present

## 2014-01-14 DIAGNOSIS — M169 Osteoarthritis of hip, unspecified: Secondary | ICD-10-CM | POA: Insufficient documentation

## 2014-01-14 HISTORY — DX: Sleep disorder, unspecified: G47.9

## 2014-01-14 HISTORY — DX: Adverse effect of unspecified anesthetic, initial encounter: T41.45XA

## 2014-01-14 HISTORY — DX: Fibromyalgia: M79.7

## 2014-01-14 HISTORY — DX: Other complications of anesthesia, initial encounter: T88.59XA

## 2014-01-14 HISTORY — DX: Other specified postprocedural states: R11.2

## 2014-01-14 HISTORY — DX: Other specified postprocedural states: Z98.890

## 2014-01-14 LAB — URINE MICROSCOPIC-ADD ON

## 2014-01-14 LAB — COMPREHENSIVE METABOLIC PANEL
ALBUMIN: 3.9 g/dL (ref 3.5–5.2)
ALT: 7 U/L (ref 0–35)
ANION GAP: 12 (ref 5–15)
AST: 14 U/L (ref 0–37)
Alkaline Phosphatase: 79 U/L (ref 39–117)
BILIRUBIN TOTAL: 0.3 mg/dL (ref 0.3–1.2)
BUN: 11 mg/dL (ref 6–23)
CO2: 25 mEq/L (ref 19–32)
Calcium: 8.9 mg/dL (ref 8.4–10.5)
Chloride: 103 mEq/L (ref 96–112)
Creatinine, Ser: 0.7 mg/dL (ref 0.50–1.10)
GFR calc Af Amer: >90 mL/min (ref >90)
GFR calc non Af Amer: 84 mL/min — ABNORMAL LOW (ref >90)
Glucose, Bld: 98 mg/dL (ref 70–99)
Potassium: 4.1 mEq/L (ref 3.7–5.3)
Sodium: 140 mEq/L (ref 137–147)
Total Protein: 6.9 g/dL (ref 6.0–8.3)

## 2014-01-14 LAB — SURGICAL PCR SCREEN
MRSA, PCR: NEGATIVE
STAPHYLOCOCCUS AUREUS: NEGATIVE

## 2014-01-14 LAB — URINALYSIS, ROUTINE W REFLEX MICROSCOPIC
Bilirubin Urine: NEGATIVE
Glucose, UA: NEGATIVE mg/dL
HGB URINE DIPSTICK: NEGATIVE
Ketones, ur: NEGATIVE mg/dL
Nitrite: NEGATIVE
Protein, ur: NEGATIVE mg/dL
SPECIFIC GRAVITY, URINE: 1.02 (ref 1.005–1.030)
UROBILINOGEN UA: 0.2 mg/dL (ref 0.0–1.0)
pH: 5 (ref 5.0–8.0)

## 2014-01-14 LAB — PROTIME-INR
INR: 1.01 (ref 0.00–1.49)
Prothrombin Time: 13.4 seconds (ref 11.6–15.2)

## 2014-01-14 LAB — ABO/RH: ABO/RH(D): AB POS

## 2014-01-14 LAB — CBC
HEMATOCRIT: 38.5 % (ref 36.0–46.0)
HEMOGLOBIN: 13.1 g/dL (ref 12.0–15.0)
MCH: 27.6 pg (ref 26.0–34.0)
MCHC: 34 g/dL (ref 30.0–36.0)
MCV: 81.1 fL (ref 78.0–100.0)
Platelets: 184 10*3/uL (ref 150–400)
RBC: 4.75 MIL/uL (ref 3.87–5.11)
RDW: 13.8 % (ref 11.5–15.5)
WBC: 4.4 10*3/uL (ref 4.0–10.5)

## 2014-01-14 LAB — APTT: APTT: 32 s (ref 24–37)

## 2014-01-14 NOTE — Patient Instructions (Addendum)
Dominique Hawkins  01/14/2014                           YOUR PROCEDURE IS SCHEDULED ON: 01/18/14                ENTER FROM [address] - GO TO [address]               LOOK FOR [facility]  / [employer]                              FOLLOW  SIGNS TO SHORT STAY CENTER                 ARRIVE AT SHORT STAY AT: 12:30 pm               CALL THIS NUMBER IF ANY PROBLEMS THE DAY OF SURGERY :               [phone]                                REMEMBER:   Do not eat  AFTER MIDNIGHT              MAY HAVE CLEAR LIQUIDS UNTIL 9:30 AM     CLEAR LIQUID DIET   Foods Allowed                                                                     Foods Excluded  Coffee and tea, regular and decaf                             liquids that you cannot  Plain Jell-O in any flavor                                             see through such as: Fruit ices (not with fruit pulp)                                     milk, soups, orange juice  Iced Popsicles                                    All solid food Carbonated beverages, regular and diet                                    Cranberry, grape and apple juices Sports drinks like Gatorade Lightly seasoned clear broth or consume(fat free) Sugar, honey syrup  _____________________________________________________________________                    Take these medicines the morning of surgery with  A SIPS OF WATER :   WELCHOL / PROTONIX / MAY TAKE OXYCODONE IF NEEDED      Do not wear jewelry, make-up   Do not wear lotions, powders, or perfumes.   Do not shave legs or underarms 12 hrs. before surgery (men may shave face)  Do not bring valuables to the hospital.  Contacts, dentures or bridgework may not be worn into surgery.  Leave suitcase in the car. After surgery it may be brought to your room.  For patients admitted to the hospital more than one night, checkout time is            11:00 AM                                                       ________________________________________________________________________                                                                                                  [place]  Before surgery, you can play an important role.  Because skin is not sterile, your skin needs to be as free of germs as possible.  You can reduce the number of germs on your skin by washing with CHG (chlorahexidine gluconate) soap before surgery.  CHG is an antiseptic cleaner which kills germs and bonds with the skin to continue killing germs even after washing. Please DO NOT use if you have an allergy to CHG or antibacterial soaps.  If your skin becomes reddened/irritated stop using the CHG and inform your nurse when you arrive at Short Stay. Do not shave (including legs and underarms) for at least 48 hours prior to the first CHG shower.  You may shave your face. Please follow these instructions carefully:   1.  Shower with CHG Soap the night before surgery and the  morning of Surgery.   2.  If you choose to wash your hair, wash your hair first as usual with your  normal  Shampoo.   3.  After you shampoo, rinse your hair and body thoroughly to remove the  shampoo.                                         4.  Use CHG as you would any other liquid soap.  You can apply chg directly  to the skin and wash . Gently wash with scrungie or clean wascloth    5.  Apply the CHG Soap to your body ONLY FROM THE NECK DOWN.   Do not use on open                           Wound or open sores. Avoid contact with eyes, ears mouth and genitals (private parts).  Genitals (private parts) with your normal soap.              6.  Wash thoroughly, paying special attention to the area where your surgery  will be performed.   7.  Thoroughly rinse your body with warm water from the neck down.   8.  DO NOT shower/wash with your normal soap after using and rinsing  off  the CHG Soap .                9.  Pat yourself dry with a clean towel.             10.  Wear clean pajamas.             11.  Place clean sheets on your bed the night of your first shower and do not  sleep with pets.  Day of Surgery : Do not apply any lotions/deodorants the morning of surgery.  Please wear clean clothes to the hospital/surgery center.  FAILURE TO FOLLOW THESE INSTRUCTIONS MAY RESULT IN THE CANCELLATION OF YOUR SURGERY    PATIENT SIGNATURE_________________________________  ______________________________________________________________________     [name]  An incentive spirometer is a tool that can help keep your lungs clear and active. This tool measures how well you are filling your lungs with each breath. Taking long deep breaths may help reverse or decrease the chance of developing breathing (pulmonary) problems (especially infection) following:  A long period of time when you are unable to move or be active. BEFORE THE PROCEDURE   If the spirometer includes an indicator to show your best effort, your nurse or respiratory therapist will set it to a desired goal.  If possible, sit up straight or lean slightly forward. Try not to slouch.  Hold the incentive spirometer in an upright position. INSTRUCTIONS FOR USE  1. Sit on the edge of your bed if possible, or sit up as far as you can in bed or on a chair. 2. Hold the incentive spirometer in an upright position. 3. Breathe out normally. 4. Place the mouthpiece in your mouth and seal your lips tightly around it. 5. Breathe in slowly and as deeply as possible, raising the piston or the ball toward the top of the column. 6. Hold your breath for 3-5 seconds or for as long as possible. Allow the piston or ball to fall to the bottom of the column. 7. Remove the mouthpiece from your mouth and breathe out normally. 8. Rest for a few seconds and repeat Steps 1 through 7 at least 10 times every 1-2  hours when you are awake. Take your time and take a few normal breaths between deep breaths. 9. The spirometer may include an indicator to show your best effort. Use the indicator as a goal to work toward during each repetition. 10. After each set of 10 deep breaths, practice coughing to be sure your lungs are clear. If you have an incision (the cut made at the time of surgery), support your incision when coughing by placing a pillow or rolled up towels firmly against it. Once you are able to get out of bed, walk around indoors and cough well. You may stop using the incentive spirometer when instructed by your caregiver.  RISKS AND COMPLICATIONS  Take your time so you do not get dizzy or light-headed.  If you are in pain, you may need to take or ask for pain medication before doing incentive spirometry. It is harder to take a  deep breath if you are having pain. AFTER USE  Rest and breathe slowly and easily.  It can be helpful to keep track of a log of your progress. Your caregiver can provide you with a simple table to help with this. If you are using the spirometer at home, follow these instructions: [place] IF:   You are having difficultly using the spirometer.  You have trouble using the spirometer as often as instructed.  Your pain medication is not giving enough relief while using the spirometer.  You develop fever of 100.5 F (38.1 C) or higher. SEEK IMMEDIATE MEDICAL CARE IF:   You cough up bloody sputum that had not been present before.  You develop fever of 102 F (38.9 C) or greater.  You develop worsening pain at or near the incision site. MAKE SURE YOU:   Understand these instructions.  Will watch your condition.  Will get help right away if you are not doing well or get worse. Document Released: 07/23/2006 Document Revised: 06/04/2011 Document Reviewed: 09/23/2006 Exit[employer] Patient Information 2014 [employer],  [place].   ________________________________________________________________________  WHAT IS A BLOOD TRANSFUSION? Blood Transfusion Information  A transfusion is the replacement of blood or some of its parts. Blood is made up of multiple cells which provide different functions.  Red blood cells carry oxygen and are used for blood loss replacement.  White blood cells fight against infection.  Platelets control bleeding.  Plasma helps clot blood.  Other blood products are available for specialized needs, such as hemophilia or other clotting disorders. BEFORE THE TRANSFUSION  Who gives blood for transfusions?   Healthy volunteers who are fully evaluated to make sure their blood is safe. This is blood bank blood. Transfusion therapy is the safest it has ever been in the practice of medicine. Before blood is taken from a donor, a complete history is taken to make sure that person has no history of diseases nor engages in risky social behavior (examples are intravenous drug use or sexual activity with multiple partners). The donor's travel history is screened to minimize risk of transmitting infections, such as malaria. The donated blood is tested for signs of infectious diseases, such as HIV and hepatitis. The blood is then tested to be sure it is compatible with you in order to minimize the chance of a transfusion reaction. If you or a relative donates blood, this is often done in anticipation of surgery and is not appropriate for emergency situations. It takes many days to process the donated blood. RISKS AND COMPLICATIONS Although transfusion therapy is very safe and saves many lives, the main dangers of transfusion include:   Getting an infectious disease.  Developing a transfusion reaction. This is an allergic reaction to something in the blood you were given. Every precaution is taken to prevent this. The decision to have a blood transfusion has been considered carefully by your caregiver  before blood is given. Blood is not given unless the benefits outweigh the risks. AFTER THE TRANSFUSION  Right after receiving a blood transfusion, you will usually feel much better and more energetic. This is especially true if your red blood cells have gotten low (anemic). The transfusion raises the level of the red blood cells which carry oxygen, and this usually causes an energy increase.  The nurse administering the transfusion will monitor you carefully for complications. HOME CARE INSTRUCTIONS  No special instructions are needed after a transfusion. You may find your energy is better. Speak with your caregiver about  any limitations on activity for underlying diseases you may have. SEEK MEDICAL CARE IF:   Your condition is not improving after your transfusion.  You develop redness or irritation at the intravenous (IV) site. SEEK IMMEDIATE MEDICAL CARE IF:  Any of the following symptoms occur over the next 12 hours:  Shaking chills.  You have a temperature by mouth above 102 F (38.9 C), not controlled by medicine.  Chest, back, or muscle pain.  People around you feel you are not acting correctly or are confused.  Shortness of breath or difficulty breathing.  Dizziness and fainting.  You get a rash or develop hives.  You have a decrease in urine output.  Your urine turns a dark color or changes to pink, red, or brown. Any of the following symptoms occur over the next 10 days:  You have a temperature by mouth above 102 F (38.9 C), not controlled by medicine.  Shortness of breath.  Weakness after normal activity.  The white part of the eye turns yellow (jaundice).  You have a decrease in the amount of urine or are urinating less often.  Your urine turns a dark color or changes to pink, red, or brown. Document Released: 03/09/2000 Document Revised: 06/04/2011 Document Reviewed: 10/27/2007 [facility] Patient Information 2014 [place],  [place].  _______________________________________________________________________

## 2014-01-14 NOTE — Anesthesia Preprocedure Evaluation (Addendum)
Anesthesia Evaluation  Patient identified by MRN, date of birth, ID band Patient awake    Reviewed: Allergy & Precautions, H&P , NPO status , Patient's Chart, lab work & pertinent test results  History of Anesthesia Complications (+) PONV and history of anesthetic complications  Airway Mallampati: II  TM Distance: >3 FB Neck ROM: Full    Dental no notable dental hx. (+) Edentulous Upper, Dental Advisory Given   Pulmonary neg pulmonary ROS,  breath sounds clear to auscultation  Pulmonary exam normal       Cardiovascular + Peripheral Vascular Disease Rhythm:Regular Rate:Normal     Neuro/Psych negative neurological ROS  negative psych ROS   GI/Hepatic Neg liver ROS, GERD-  Medicated and Controlled,  Endo/Other  negative endocrine ROS  Renal/GU negative Renal ROS  negative genitourinary   Musculoskeletal  (+) Fibromyalgia -  Abdominal   Peds negative pediatric ROS (+)  Hematology negative hematology ROS (+)   Anesthesia Other Findings   Reproductive/Obstetrics negative OB ROS                             Anesthesia Physical Anesthesia Plan  ASA: II  Anesthesia Plan: Spinal   Post-op Pain Management:    Induction: Intravenous  Airway Management Planned: Nasal Cannula  Additional Equipment:   Intra-op Plan:   Post-operative Plan: Extubation in OR  Informed Consent: I have reviewed the patients History and Physical, chart, labs and discussed the procedure including the risks, benefits and alternatives for the proposed anesthesia with the patient or authorized representative who has indicated his/her understanding and acceptance.   Dental advisory given  Plan Discussed with: CRNA  Anesthesia Plan Comments: (Discussed R/B/O with patient regarding general vs spinal. Discussed risks including but not limited to bleeding, infection, damage to nerves, headache, and block failure. Labs  and medications reviewed. Patient denies any anticoagulation. )       Anesthesia Quick Evaluation

## 2014-01-15 NOTE — Progress Notes (Signed)
UA faxed to Dr. Aluisio 

## 2014-01-17 ENCOUNTER — Ambulatory Visit: Payer: Self-pay | Admitting: Orthopedic Surgery

## 2014-01-17 NOTE — H&P (Signed)
Dominique Hawkins DOB: 08/02/1940 Married / Language: English / Race: White Female Date of Admission:  01-18-2014 Chief Complaint:  Left Hip Pain History of Present Illness The patient is a 73 year old female who comes in for a preoperative history and physical. The patient is scheduled for a left total hip arthroplasty (anterior approach) to be performed by Dr. Dione Plover. Aluisio, MD at [facility] on 01/18/2014. The patient is a 73 year old female who presents with a hip problem. The patient reports left hip problems including pain and catching symptoms that have been present for 4 month(s). The symptoms began without any known injury. She reports that she has had left hip pain for the since February. She did not have nay injury or change in her acitvity. She reports that she initially saw her PCP who told her that she should see ortho. She has been seen by Dr. Gladstone Lighter. He initially treated her for bursitis. She did not have any relief with the cortisone injection. She did have an MRI which showed some moderate OA as well as questionable insufficiency fracture. No history of osteoporosis. She has never gotten any relief. She has pain all the time, but it is worse with weightbearing and activity such as getting in and out of the car. She is having pain radiate into the knee and at times down the calf. She is very frustrated because this is dictating every aspect of her life. No previous surgeries or injections in the left hip. Dominique Hawkins has been having trouble with her hip for many months now. Unfortunately it is getting progressively worse. Pain is in her groin and buttock and radiates down her thigh. It is hurting at all times including at night. It has become progressively worse over the past few months. She had an MRI from Dr. Gladstone Lighter back in March. She was told she had bursitis. She had been treated for bursitis. Unfortunately, the pain has become worse. She had x-rays at [facility] in June showing arthritis.  She states she was told that her MRI showed a fracture recently.  She said it has essentially taken over her life at this point. She would like to have some done.  She is ready to proceed with surgery. Risks and benefits of the surgery have been discussed with the patient and they elect to proceed with surgery.  There are on active contraindications to upcoming procedure such as ongoing infection or progressive neurological disease.  Allergies No Know Drug Allergies   Intolerances Codeine/Codeine Derivatives Nausea. Able to take Oxycodone if she eats food with it. TraMADol HCl *ANALGESICS - OPIOID* Nausea. light headedness  Problem List/Past Medical  Bursitis, hip (M70.70) Arthralgia of left hip (M25.552) Lumbar spine pain (724.2) Osteoarthritis of left hip (M16.12) Gastroesophageal Reflux Disease Irritable bowel syndrome Cataract Varicose veins Urinary Incontinence Menopause Measles Mumps Fibroma Hyperlipidemia Vitamin D Def  Family History Diabetes Mellitus Brother, Maternal Grandfather. Heart disease in female family member before age 54 First Degree Relatives reported Cancer Father, Mother. Heart Disease Paternal Grandmother.  Social History Tobacco use Never smoker. 06/05/2013 No history of drug/alcohol rehab Living situation live with spouse Current work status working full time Current drinker 06/05/2013: Currently drinks wine only occasionally per week Exercise does running / walking Children 2 Marital status married Post-Surgical Plans Looking into Rehab at [facility] or [facility]  Medication History  OxyCODONE HCl (5MG  Tablet, 1-2 Tablet Oral every six hours, as needed, Taken starting 01/07/2014) Active. Vitamin D3 (1000UNIT Capsule,  Oral) Active. Welchol (625MG  Tablet, Oral) Active.  Past Surgical History Hysterectomy partial (non-cancerous) Tubal Ligation Cataract Surgery bilateral Gallbladder Surgery  laporoscopic   Review of Systems  General Not Present- Chills, Fatigue, Fever, Memory Loss, Night Sweats, Weight Gain and Weight Loss. Skin Not Present- Eczema, Hives, Itching, Lesions and Rash. HEENT Not Present- Dentures, Double Vision, Headache, Hearing Loss, Tinnitus and Visual Loss. Respiratory Not Present- Allergies, Chronic Cough, Coughing up blood, Shortness of breath at rest and Shortness of breath with exertion. Cardiovascular Not Present- Chest Pain, Difficulty Breathing Lying Down, Murmur, Palpitations, Racing/skipping heartbeats and Swelling. Gastrointestinal Present- Diarrhea. Not Present- Abdominal Pain, Bloody Stool, Constipation, Difficulty Swallowing, Heartburn, Jaundice, Loss of appetitie, Nausea and Vomiting. Female Genitourinary Present- Urinating at Night. Not Present- Blood in Urine, Discharge, Flank Pain, Incontinence, Painful Urination, Urgency, Urinary frequency, Urinary Retention and Weak urinary stream. Musculoskeletal Present- Joint Pain and Muscle Pain. Not Present- Back Pain, Joint Swelling, Morning Stiffness, Muscle Weakness and Spasms. Neurological Not Present- Blackout spells, Difficulty with balance, Dizziness, Paralysis, Tremor and Weakness. Psychiatric Not Present- Insomnia.  Vitals  Weight: 153 lb Height: 62in Weight was reported by patient. Height was reported by patient. Body Surface Area: 1.74 m Body Mass Index: 27.98 kg/m  Pulse: 60 (Regular)  BP: 142/82 (Sitting, Right Arm, Standard)   Physical Exam General Mental Status -Alert, cooperative and good historian. General Appearance-pleasant, Not in acute distress. Orientation-Oriented X3. Build & Nutrition-Well nourished and Well developed.  Head and Neck Head-normocephalic, atraumatic . Neck Global Assessment - supple, no bruit auscultated on the right, no bruit auscultated on the left. Note: upper denture plate   Eye Pupil - Bilateral-Regular and Round. Motion -  Bilateral-EOMI.  Chest and Lung Exam Auscultation Breath sounds - clear at anterior chest wall and clear at posterior chest wall. Adventitious sounds - No Adventitious sounds.  Cardiovascular Auscultation Rhythm - Regular rate and rhythm. Heart Sounds - S1 WNL and S2 WNL. Murmurs & Other Heart Sounds - Auscultation of the heart reveals - No Murmurs.  Abdomen Palpation/Percussion Tenderness - Abdomen is non-tender to palpation. Rigidity (guarding) - Abdomen is soft. Auscultation Auscultation of the abdomen reveals - Bowel sounds normal.  Female Genitourinary Note: Not done, not pertinent to present illness   Musculoskeletal Note: Her left hip can be flexed to about 95 degrees, minimal internal rotation, only about 10 degrees of external rotation, and 30 degrees of abduction, but all ranges of motion cause significant pain. She has a significantly antalgic gait pattern using assistive devices.  RADIOGRAPHS I reviewed her plain x-rays from [facility] hospital from June and she did have significant joint space narrowing in the left hip. She also had some narrowing in the right hip also. I reviewed the MRI scan from March and she has pretty significant subchondral cystic changes in the femoral head and acetabulum and had an effusion at the time.  Radiographs from June show her joint space on the left had narrowed even further. She is within 2 mm of being completely bone on bone.  Assessment & Plan Osteoarthritis of left hip (M16.12)  Note:Plan is for Left Total Hip Replacement - Anterior Approach by Dr. Wynelle Link.  Plan is to go to inpatient rehab at [facility] or [facility].  PCP - Dr. Ward Givens  The patient does not have any contraindications and will receive TXA (tranexamic acid) prior to surgery.  Please note that the patient does get nauseated with anesthesia in the past.  Signed electronically by Joelene Millin, III  PA-C

## 2014-01-18 ENCOUNTER — Encounter (HOSPITAL_COMMUNITY): Payer: Medicare Other | Admitting: Anesthesiology

## 2014-01-18 ENCOUNTER — Encounter (HOSPITAL_COMMUNITY)
Admission: RE | Disposition: A | Discharge status: Skilled Nursing Facility | Payer: Self-pay | Source: Ambulatory Visit | Attending: Orthopedic Surgery

## 2014-01-18 ENCOUNTER — Inpatient Hospital Stay (HOSPITAL_COMMUNITY): Payer: Medicare Other

## 2014-01-18 ENCOUNTER — Inpatient Hospital Stay (HOSPITAL_COMMUNITY)
Admission: RE | Admit: 2014-01-18 | Discharge: 2014-01-20 | DRG: 470 | Disposition: A | Discharge status: Skilled Nursing Facility | Payer: Medicare Other | Source: Ambulatory Visit | Attending: Orthopedic Surgery | Admitting: Orthopedic Surgery

## 2014-01-18 ENCOUNTER — Inpatient Hospital Stay (HOSPITAL_COMMUNITY): Payer: Medicare Other | Admitting: Anesthesiology

## 2014-01-18 ENCOUNTER — Encounter (HOSPITAL_COMMUNITY): Payer: Self-pay | Admitting: *Deleted

## 2014-01-18 DIAGNOSIS — K219 Gastro-esophageal reflux disease without esophagitis: Secondary | ICD-10-CM | POA: Diagnosis present

## 2014-01-18 DIAGNOSIS — E559 Vitamin D deficiency, unspecified: Secondary | ICD-10-CM | POA: Diagnosis present

## 2014-01-18 DIAGNOSIS — M1612 Unilateral primary osteoarthritis, left hip: Principal | ICD-10-CM | POA: Diagnosis present

## 2014-01-18 DIAGNOSIS — Z6828 Body mass index (BMI) 28.0-28.9, adult: Secondary | ICD-10-CM

## 2014-01-18 DIAGNOSIS — M810 Age-related osteoporosis without current pathological fracture: Secondary | ICD-10-CM | POA: Diagnosis present

## 2014-01-18 DIAGNOSIS — M797 Fibromyalgia: Secondary | ICD-10-CM | POA: Diagnosis present

## 2014-01-18 DIAGNOSIS — M169 Osteoarthritis of hip, unspecified: Secondary | ICD-10-CM | POA: Diagnosis present

## 2014-01-18 DIAGNOSIS — Z96649 Presence of unspecified artificial hip joint: Secondary | ICD-10-CM

## 2014-01-18 DIAGNOSIS — E785 Hyperlipidemia, unspecified: Secondary | ICD-10-CM | POA: Diagnosis present

## 2014-01-18 DIAGNOSIS — M25552 Pain in left hip: Secondary | ICD-10-CM | POA: Diagnosis present

## 2014-01-18 HISTORY — PX: TOTAL HIP ARTHROPLASTY: SHX124

## 2014-01-18 LAB — TYPE AND SCREEN
ABO/RH(D): AB POS
ANTIBODY SCREEN: NEGATIVE

## 2014-01-18 SURGERY — ARTHROPLASTY, HIP, TOTAL, ANTERIOR APPROACH
Anesthesia: Spinal | Site: Hip | Laterality: Left

## 2014-01-18 MED ORDER — STERILE WATER FOR IRRIGATION IR SOLN
Status: DC | PRN
  Administered 2014-01-18: 1500 mL

## 2014-01-18 MED ORDER — MIDAZOLAM HCL 2 MG/2ML IJ SOLN
INTRAMUSCULAR | Status: AC
  Filled 2014-01-18: qty 2

## 2014-01-18 MED ORDER — ACETAMINOPHEN 500 MG PO TABS
1000.0000 mg | ORAL_TABLET | Freq: Four times a day (QID) | ORAL | Status: AC
  Administered 2014-01-18 – 2014-01-19 (×4): 1000 mg via ORAL
  Filled 2014-01-18 (×7): qty 2

## 2014-01-18 MED ORDER — RIVAROXABAN 10 MG PO TABS
10.0000 mg | ORAL_TABLET | Freq: Every day | ORAL | Status: DC
  Administered 2014-01-19 – 2014-01-20 (×2): 10 mg via ORAL
  Filled 2014-01-18 (×3): qty 1

## 2014-01-18 MED ORDER — BISACODYL 10 MG RE SUPP: 10.0000 mg | Freq: Every day | RECTAL | Status: DC | PRN

## 2014-01-18 MED ORDER — LIDOCAINE HCL (CARDIAC) 20 MG/ML IV SOLN
INTRAVENOUS | Status: DC | PRN
  Administered 2014-01-18: 100 mg via INTRAVENOUS

## 2014-01-18 MED ORDER — ONDANSETRON HCL 4 MG PO TABS: 4.0000 mg | ORAL_TABLET | Freq: Four times a day (QID) | ORAL | Status: DC | PRN

## 2014-01-18 MED ORDER — LACTATED RINGERS IV SOLN
INTRAVENOUS | Status: DC
  Administered 2014-01-18: 17:00:00 via INTRAVENOUS
  Administered 2014-01-18: 1000 mL via INTRAVENOUS

## 2014-01-18 MED ORDER — CEFAZOLIN SODIUM-DEXTROSE 2-3 GM-% IV SOLR
2.0000 g | INTRAVENOUS | Status: AC
  Administered 2014-01-18: 2 g via INTRAVENOUS

## 2014-01-18 MED ORDER — ONDANSETRON HCL 4 MG/2ML IJ SOLN: 4.0000 mg | Freq: Once | INTRAMUSCULAR | Status: DC | PRN

## 2014-01-18 MED ORDER — PANTOPRAZOLE SODIUM 40 MG PO TBEC: 40.0000 mg | DELAYED_RELEASE_TABLET | Freq: Every day | ORAL | Status: DC | PRN

## 2014-01-18 MED ORDER — PHENYLEPHRINE 40 MCG/ML (10ML) SYRINGE FOR IV PUSH (FOR BLOOD PRESSURE SUPPORT)
PREFILLED_SYRINGE | INTRAVENOUS | Status: AC
  Filled 2014-01-18: qty 10

## 2014-01-18 MED ORDER — DEXAMETHASONE SODIUM PHOSPHATE 10 MG/ML IJ SOLN
10.0000 mg | Freq: Once | INTRAMUSCULAR | Status: AC
  Administered 2014-01-18: 10 mg via INTRAVENOUS

## 2014-01-18 MED ORDER — DEXAMETHASONE SODIUM PHOSPHATE 10 MG/ML IJ SOLN
10.0000 mg | Freq: Once | INTRAMUSCULAR | Status: AC
  Administered 2014-01-19: 10 mg via INTRAVENOUS
  Filled 2014-01-18: qty 1

## 2014-01-18 MED ORDER — ACETAMINOPHEN 650 MG RE SUPP: 650.0000 mg | Freq: Four times a day (QID) | RECTAL | Status: DC | PRN

## 2014-01-18 MED ORDER — DIPHENHYDRAMINE HCL 12.5 MG/5ML PO ELIX: 12.5000 mg | ORAL_SOLUTION | ORAL | Status: DC | PRN

## 2014-01-18 MED ORDER — PHENYLEPHRINE HCL 10 MG/ML IJ SOLN
INTRAMUSCULAR | Status: DC | PRN
  Administered 2014-01-18 (×4): 80 ug via INTRAVENOUS

## 2014-01-18 MED ORDER — MENTHOL 3 MG MT LOZG
1.0000 | LOZENGE | OROMUCOSAL | Status: DC | PRN
  Filled 2014-01-18: qty 9

## 2014-01-18 MED ORDER — BUPIVACAINE HCL (PF) 0.5 % IJ SOLN
INTRAMUSCULAR | Status: DC | PRN
  Administered 2014-01-18: 2.5 mL

## 2014-01-18 MED ORDER — TRAMADOL HCL 50 MG PO TABS: 50.0000 mg | ORAL_TABLET | Freq: Four times a day (QID) | ORAL | Status: DC | PRN

## 2014-01-18 MED ORDER — CEFAZOLIN SODIUM-DEXTROSE 2-3 GM-% IV SOLR
2.0000 g | Freq: Four times a day (QID) | INTRAVENOUS | Status: AC
  Administered 2014-01-18 – 2014-01-19 (×2): 2 g via INTRAVENOUS
  Filled 2014-01-18 (×2): qty 50

## 2014-01-18 MED ORDER — METOCLOPRAMIDE HCL 5 MG/ML IJ SOLN: 5.0000 mg | Freq: Three times a day (TID) | INTRAMUSCULAR | Status: DC | PRN

## 2014-01-18 MED ORDER — BUPIVACAINE LIPOSOME 1.3 % IJ SUSP
20.0000 mL | Freq: Once | INTRAMUSCULAR | Status: DC
  Filled 2014-01-18: qty 20

## 2014-01-18 MED ORDER — PROPOFOL 10 MG/ML IV BOLUS
INTRAVENOUS | Status: AC
  Filled 2014-01-18: qty 40

## 2014-01-18 MED ORDER — BUPIVACAINE HCL (PF) 0.25 % IJ SOLN
INTRAMUSCULAR | Status: DC | PRN
Start: 2014-01-18 — End: 2014-01-18
  Administered 2014-01-18: 20 mL

## 2014-01-18 MED ORDER — BUPIVACAINE LIPOSOME 1.3 % IJ SUSP
INTRAMUSCULAR | Status: DC | PRN
  Administered 2014-01-18: 20 mL

## 2014-01-18 MED ORDER — ONDANSETRON HCL 4 MG/2ML IJ SOLN
INTRAMUSCULAR | Status: AC
  Filled 2014-01-18: qty 2

## 2014-01-18 MED ORDER — ONDANSETRON HCL 4 MG/2ML IJ SOLN
INTRAMUSCULAR | Status: DC | PRN
  Administered 2014-01-18: 4 mg via INTRAVENOUS

## 2014-01-18 MED ORDER — ACETAMINOPHEN 325 MG PO TABS: 650.0000 mg | ORAL_TABLET | Freq: Four times a day (QID) | ORAL | Status: DC | PRN

## 2014-01-18 MED ORDER — DOCUSATE SODIUM 100 MG PO CAPS
100.0000 mg | ORAL_CAPSULE | Freq: Two times a day (BID) | ORAL | Status: DC
  Administered 2014-01-19 – 2014-01-20 (×3): 100 mg via ORAL

## 2014-01-18 MED ORDER — FENTANYL CITRATE 0.05 MG/ML IJ SOLN: 25.0000 ug | INTRAMUSCULAR | Status: DC | PRN

## 2014-01-18 MED ORDER — MIDAZOLAM HCL 5 MG/5ML IJ SOLN
INTRAMUSCULAR | Status: DC | PRN
  Administered 2014-01-18 (×2): 1 mg via INTRAVENOUS

## 2014-01-18 MED ORDER — 0.9 % SODIUM CHLORIDE (POUR BTL) OPTIME
TOPICAL | Status: DC | PRN
  Administered 2014-01-18: 1000 mL

## 2014-01-18 MED ORDER — TRANEXAMIC ACID 100 MG/ML IV SOLN
1000.0000 mg | INTRAVENOUS | Status: AC
  Administered 2014-01-18: 1000 mg via INTRAVENOUS
  Filled 2014-01-18: qty 10

## 2014-01-18 MED ORDER — METHOCARBAMOL 500 MG PO TABS
500.0000 mg | ORAL_TABLET | Freq: Four times a day (QID) | ORAL | Status: DC | PRN
  Administered 2014-01-19 – 2014-01-20 (×3): 500 mg via ORAL
  Filled 2014-01-18 (×4): qty 1

## 2014-01-18 MED ORDER — SODIUM CHLORIDE 0.9 % IV SOLN
INTRAVENOUS | Status: DC
  Administered 2014-01-19: 01:00:00 via INTRAVENOUS

## 2014-01-18 MED ORDER — METHOCARBAMOL 1000 MG/10ML IJ SOLN
500.0000 mg | Freq: Four times a day (QID) | INTRAMUSCULAR | Status: DC | PRN
  Administered 2014-01-18 – 2014-01-19 (×2): 500 mg via INTRAVENOUS
  Filled 2014-01-18 (×2): qty 5

## 2014-01-18 MED ORDER — CEFAZOLIN SODIUM-DEXTROSE 2-3 GM-% IV SOLR
INTRAVENOUS | Status: AC
  Filled 2014-01-18: qty 50

## 2014-01-18 MED ORDER — PHENYLEPHRINE HCL 10 MG/ML IJ SOLN
INTRAMUSCULAR | Status: AC
  Filled 2014-01-18: qty 1

## 2014-01-18 MED ORDER — PHENOL 1.4 % MT LIQD
1.0000 | OROMUCOSAL | Status: DC | PRN
Start: 2014-01-18 — End: 2014-01-20
  Filled 2014-01-18: qty 177

## 2014-01-18 MED ORDER — PROPOFOL 10 MG/ML IV BOLUS
INTRAVENOUS | Status: AC
  Filled 2014-01-18: qty 20

## 2014-01-18 MED ORDER — SODIUM CHLORIDE 0.9 % IV SOLN: INTRAVENOUS | Status: DC

## 2014-01-18 MED ORDER — LIDOCAINE HCL (CARDIAC) 20 MG/ML IV SOLN
INTRAVENOUS | Status: AC
  Filled 2014-01-18: qty 5

## 2014-01-18 MED ORDER — ACETAMINOPHEN 10 MG/ML IV SOLN
1000.0000 mg | Freq: Once | INTRAVENOUS | Status: AC
  Administered 2014-01-18: 1000 mg via INTRAVENOUS
  Filled 2014-01-18: qty 100

## 2014-01-18 MED ORDER — ONDANSETRON HCL 4 MG/2ML IJ SOLN: 4.0000 mg | Freq: Four times a day (QID) | INTRAMUSCULAR | Status: DC | PRN

## 2014-01-18 MED ORDER — KETOROLAC TROMETHAMINE 15 MG/ML IJ SOLN
7.5000 mg | Freq: Four times a day (QID) | INTRAMUSCULAR | Status: AC | PRN
  Administered 2014-01-18: 7.5 mg via INTRAVENOUS
  Filled 2014-01-18: qty 1

## 2014-01-18 MED ORDER — COLESEVELAM HCL 625 MG PO TABS
1250.0000 mg | ORAL_TABLET | Freq: Every day | ORAL | Status: DC
  Filled 2014-01-18: qty 2

## 2014-01-18 MED ORDER — DEXAMETHASONE SODIUM PHOSPHATE 10 MG/ML IJ SOLN
INTRAMUSCULAR | Status: AC
  Filled 2014-01-18: qty 1

## 2014-01-18 MED ORDER — SODIUM CHLORIDE 0.9 % IJ SOLN
INTRAMUSCULAR | Status: DC | PRN
  Administered 2014-01-18: 30 mL

## 2014-01-18 MED ORDER — HYDROMORPHONE HCL 1 MG/ML IJ SOLN
0.5000 mg | INTRAMUSCULAR | Status: DC | PRN
  Administered 2014-01-18: 1 mg via INTRAVENOUS
  Administered 2014-01-18: 0.5 mg via INTRAVENOUS
  Administered 2014-01-19 (×2): 1 mg via INTRAVENOUS
  Filled 2014-01-18 (×4): qty 1

## 2014-01-18 MED ORDER — HYDROMORPHONE HCL 2 MG PO TABS
2.0000 mg | ORAL_TABLET | ORAL | Status: DC | PRN
  Administered 2014-01-18: 2 mg via ORAL
  Administered 2014-01-19 – 2014-01-20 (×9): 4 mg via ORAL
  Filled 2014-01-18 (×2): qty 2
  Filled 2014-01-18: qty 1
  Filled 2014-01-18 (×8): qty 2

## 2014-01-18 MED ORDER — FLEET ENEMA 7-19 GM/118ML RE ENEM: 1.0000 | ENEMA | Freq: Once | RECTAL | Status: AC | PRN

## 2014-01-18 MED ORDER — BUPIVACAINE HCL (PF) 0.25 % IJ SOLN
INTRAMUSCULAR | Status: AC
  Filled 2014-01-18: qty 30

## 2014-01-18 MED ORDER — CHLORHEXIDINE GLUCONATE 4 % EX LIQD: 60.0000 mL | Freq: Once | CUTANEOUS | Status: DC

## 2014-01-18 MED ORDER — METOCLOPRAMIDE HCL 10 MG PO TABS: 5.0000 mg | ORAL_TABLET | Freq: Three times a day (TID) | ORAL | Status: DC | PRN

## 2014-01-18 MED ORDER — PROPOFOL INFUSION 10 MG/ML OPTIME
INTRAVENOUS | Status: DC | PRN
  Administered 2014-01-18: 100 ug/kg/min via INTRAVENOUS

## 2014-01-18 MED ORDER — SODIUM CHLORIDE 0.9 % IJ SOLN
INTRAMUSCULAR | Status: AC
  Filled 2014-01-18: qty 50

## 2014-01-18 MED ORDER — POLYETHYLENE GLYCOL 3350 17 G PO PACK
17.0000 g | PACK | Freq: Every day | ORAL | Status: DC | PRN
  Administered 2014-01-20: 17 g via ORAL

## 2014-01-18 SURGICAL SUPPLY — 37 items
BAG ZIPLOCK 12X15 (MISCELLANEOUS) IMPLANT
BLADE EXTENDED COATED 6.5IN (ELECTRODE) ×2 IMPLANT
BLADE SAG 18X100X1.27 (BLADE) ×2 IMPLANT
CAPT HIP PF COP ×2 IMPLANT
COVER PERINEAL POST (MISCELLANEOUS) ×2 IMPLANT
DECANTER SPIKE VIAL GLASS SM (MISCELLANEOUS) ×2 IMPLANT
DRAPE C-ARM 42X120 X-RAY (DRAPES) ×2 IMPLANT
DRAPE STERI IOBAN 125X83 (DRAPES) ×2 IMPLANT
DRAPE U-SHAPE 47X51 STRL (DRAPES) ×6 IMPLANT
DRSG ADAPTIC 3X8 NADH LF (GAUZE/BANDAGES/DRESSINGS) ×2 IMPLANT
DRSG MEPILEX BORDER 4X4 (GAUZE/BANDAGES/DRESSINGS) ×2 IMPLANT
DRSG MEPILEX BORDER 4X8 (GAUZE/BANDAGES/DRESSINGS) ×2 IMPLANT
DURAPREP 26ML APPLICATOR (WOUND CARE) ×2 IMPLANT
ELECT REM PT RETURN 9FT ADLT (ELECTROSURGICAL) ×2
ELECTRODE REM PT RTRN 9FT ADLT (ELECTROSURGICAL) ×1 IMPLANT
EVACUATOR 1/8 PVC DRAIN (DRAIN) ×2 IMPLANT
FACESHIELD WRAPAROUND (MASK) ×8 IMPLANT
GLOVE BIO SURGEON STRL SZ7.5 (GLOVE) ×2 IMPLANT
GLOVE BIO SURGEON STRL SZ8 (GLOVE) ×4 IMPLANT
GLOVE BIOGEL PI IND STRL 8 (GLOVE) ×2 IMPLANT
GLOVE BIOGEL PI INDICATOR 8 (GLOVE) ×2
GOWN STRL REUS W/TWL LRG LVL3 (GOWN DISPOSABLE) ×2 IMPLANT
GOWN STRL REUS W/TWL XL LVL3 (GOWN DISPOSABLE) ×2 IMPLANT
KIT BASIN OR (CUSTOM PROCEDURE TRAY) ×2 IMPLANT
NDL SAFETY ECLIPSE 18X1.5 (NEEDLE) ×2 IMPLANT
NEEDLE HYPO 18GX1.5 SHARP (NEEDLE) ×2
PACK TOTAL JOINT (CUSTOM PROCEDURE TRAY) ×2 IMPLANT
STRIP CLOSURE SKIN 1/2X4 (GAUZE/BANDAGES/DRESSINGS) ×4 IMPLANT
SUT ETHIBOND NAB CT1 #1 30IN (SUTURE) ×2 IMPLANT
SUT MNCRL AB 4-0 PS2 18 (SUTURE) ×2 IMPLANT
SUT VIC AB 2-0 CT1 27 (SUTURE) ×2
SUT VIC AB 2-0 CT1 TAPERPNT 27 (SUTURE) ×2 IMPLANT
SUT VLOC 180 0 24IN GS25 (SUTURE) ×2 IMPLANT
SYR 20CC LL (SYRINGE) ×2 IMPLANT
SYR 50ML LL SCALE MARK (SYRINGE) ×2 IMPLANT
TOWEL OR 17X26 10 PK STRL BLUE (TOWEL DISPOSABLE) ×2 IMPLANT
TRAY FOLEY CATH 14FRSI W/METER (CATHETERS) ×2 IMPLANT

## 2014-01-18 NOTE — Anesthesia Postprocedure Evaluation (Signed)
  Anesthesia Post-op Note  Patient: Dominique Hawkins  Procedure(s) Performed: Procedure(s) (LRB): LEFT TOTAL HIP ARTHROPLASTY ANTERIOR APPROACH (Left)  Patient Location: PACU  Anesthesia Type: Spinal  Level of Consciousness: awake and alert   Airway and Oxygen Therapy: Patient Spontanous Breathing  Post-op Pain: mild  Post-op Assessment: Post-op Vital signs reviewed, Patient's Cardiovascular Status Stable, Respiratory Function Stable, Patent Airway and No signs of Nausea or vomiting  Last Vitals:  Filed Vitals:   01/18/14 1703  BP:   Pulse:   Temp: 36.4 C  Resp:     Post-op Vital Signs: stable   Complications: No apparent anesthesia complications

## 2014-01-18 NOTE — Op Note (Signed)
OPERATIVE REPORT  PREOPERATIVE DIAGNOSIS: Osteoarthritis of the Left hip.   POSTOPERATIVE DIAGNOSIS: Osteoarthritis of the Left  hip.   PROCEDURE: Left total hip arthroplasty, anterior approach.   SURGEON: Gaynelle Arabian, MD   ASSISTANT: Arlee Muslim, PA-C  ANESTHESIA:  Spinal  ESTIMATED BLOOD LOSS:-350 ml    DRAINS: Hemovac x1.   COMPLICATIONS: None   CONDITION: PACU - hemodynamically stable.   BRIEF CLINICAL NOTE: Dominique Hawkins is a 73 y.o. female who has advanced end-  stage arthritis of her Left  hip with progressively worsening pain and  dysfunction.The patient has failed nonoperative management and presents for  total hip arthroplasty.   PROCEDURE IN DETAIL: After successful administration of spinal  anesthetic, the traction boots for the [facility] bed were placed on both  feet and the patient was placed onto the [facility] bed, boots placed into the leg  holders. The Left hip was then isolated from the perineum with plastic  drapes and prepped and draped in the usual sterile fashion. ASIS and  greater trochanter were marked and a oblique incision was made, starting  at about 1 cm lateral and 2 cm distal to the ASIS and coursing towards  the anterior cortex of the femur. The skin was cut with a 10 blade  through subcutaneous tissue to the level of the fascia overlying the  tensor fascia lata muscle. The fascia was then incised in line with the  incision at the junction of the anterior third and posterior 2/3rd. The  muscle was teased off the fascia and then the interval between the TFL  and the rectus was developed. The Hohmann retractor was then placed at  the top of the femoral neck over the capsule. The vessels overlying the  capsule were cauterized and the fat on top of the capsule was removed.  A Hohmann retractor was then placed anterior underneath the rectus  femoris to give exposure to the entire anterior capsule. A T-shaped  capsulotomy was performed. The  edges were tagged and the femoral head  was identified.       Osteophytes are removed off the superior acetabulum.  The femoral neck was then cut in situ with an oscillating saw. Traction  was then applied to the left lower extremity utilizing the [facility]  traction. The femoral head was then removed. Retractors were placed  around the acetabulum and then circumferential removal of the labrum was  performed. Osteophytes were also removed. Reaming starts at 45 mm to  medialize and  Increased in 2 mm increments to 49 mm. We reamed in  approximately 40 degrees of abduction, 20 degrees anteversion. A 50 mm  pinnacle acetabular shell was then impacted in anatomic position under  fluoroscopic guidance with excellent purchase. We did not need to place  any additional dome screws. A 32 mm neutral + 4 marathon liner was then  placed into the acetabular shell.       The femoral lift was then placed along the lateral aspect of the femur  just distal to the vastus ridge. The leg was  externally rotated and capsule  was stripped off the inferior aspect of the femoral neck down to the  level of the lesser trochanter, this was done with electrocautery. The femur was lifted after this was performed. The  leg was then placed and extended in adducted position to essentially delivering the femur. We also removed the capsule superiorly and the  piriformis from the  piriformis fossa to gain excellent exposure of the  proximal femur. Rongeur was used to remove some cancellous bone to get  into the lateral portion of the proximal femur for placement of the  initial starter reamer. The starter broaches was placed  the starter broach  and was shown to go down the center of the canal. Broaching  with the  Corail system was then performed starting at size 8, coursing  Up to size 10. A size 10 had excellent torsional and rotational  and axial stability. The trial standard offset neck was then placed  with a 32 + 1 trial  head. The hip was then reduced. We confirmed that  the stem was in the canal both on AP and lateral x-rays. It also has excellent sizing. The hip was reduced with outstanding stability through full extension, full external rotation,  and then flexion in adduction internal rotation. AP pelvis was taken  and the leg lengths were measured and found to be exactly equal. Hip  was then dislocated again and the femoral head and neck removed. The  femoral broach was removed. Size 10 Corail stem with a standard offset  neck was then impacted into the femur following native anteversion. Has  excellent purchase in the canal. Excellent torsional and rotational and  axial stability. It is confirmed to be in the canal on AP and lateral  fluoroscopic views. The 32 + 1 ceramic head was placed and the hip  reduced with outstanding stability. Again AP pelvis was taken and it  confirmed that the leg lengths were equal. The wound was then copiously  irrigated with saline solution and the capsule reattached and repaired  with Ethibond suture.  20 mL of Exparel mixed with 50 mL of saline then additional 20 ml of .25% Bupivicaine injected into the capsule and into the edge of the tensor fascia lata as well as subcutaneous tissue. The fascia overlying the tensor fascia lata was  then closed with a running #1 V-Loc. Subcu was closed with interrupted  2-0 Vicryl and subcuticular running 4-0 Monocryl. Incision was cleaned  and dried. Steri-Strips and a bulky sterile dressing applied. Hemovac  drain was hooked to suction and then he was awakened and transported to  recovery in stable condition.        Please note that a surgical assistant was a medical necessity for this procedure to perform it in a safe and expeditious manner. Assistant was necessary to provide appropriate retraction of vital neurovascular structures and to prevent femoral fracture and allow for anatomic placement of the prosthesis.  Gaynelle Arabian, M.D.

## 2014-01-18 NOTE — Anesthesia Procedure Notes (Signed)
Spinal  Patient location during procedure: OR Start time: 01/18/2014 3:15 PM End time: 01/18/2014 3:20 PM Staffing Anesthesiologist: Lauretta Grill JENNETTE Performed by: anesthesiologist  Preanesthetic Checklist Completed: patient identified, site marked, surgical consent, pre-op evaluation, timeout performed, IV checked, risks and benefits discussed and monitors and equipment checked Spinal Block Patient position: sitting Prep: Betadine Patient monitoring: heart rate, continuous pulse ox and blood pressure Location: L3-4 Injection technique: single-shot Needle Needle type: Spinocan  Needle gauge: 22 G Needle length: 9 cm Assessment Sensory level: T8 Additional Notes Expiration date of kit checked and confirmed. Patient tolerated procedure well, without complications.

## 2014-01-18 NOTE — Transfer of Care (Signed)
Immediate Anesthesia Transfer of Care Note  Patient: Dominique Hawkins  Procedure(s) Performed: Procedure(s) (LRB): LEFT TOTAL HIP ARTHROPLASTY ANTERIOR APPROACH (Left)  Patient Location: PACU  Anesthesia Type: Spinal  Level of Consciousness: sedated, patient cooperative and responds to stimulation  Airway & Oxygen Therapy: Patient Spontanous Breathing and Patient connected to face mask oxgen  Post-op Assessment: Report given to PACU RN and Post -op Vital signs reviewed and stable  Post vital signs: Reviewed and stable  Complications: No apparent anesthesia complications

## 2014-01-18 NOTE — Interval H&P Note (Signed)
History and Physical Interval Note:  01/18/2014 3:02 PM  Dominique Hawkins  has presented today for surgery, with the diagnosis of LEFT HIP OA  The various methods of treatment have been discussed with the patient and family. After consideration of risks, benefits and other options for treatment, the patient has consented to  Procedure(s): LEFT TOTAL HIP ARTHROPLASTY ANTERIOR APPROACH (Left) as a surgical intervention .  The patient's history has been reviewed, patient examined, no change in status, stable for surgery.  I have reviewed the patient's chart and labs.  Questions were answered to the patient's satisfaction.     Gearlean Alf

## 2014-01-18 NOTE — Progress Notes (Signed)
Portable AP Pelvis and Lateral Left Hip X-rays done. 

## 2014-01-18 NOTE — H&P (View-Only) (Signed)
Dominique Hawkins DOB: 07-08-1940 Married / Language: English / Race: White Female Date of Admission:  01-18-2014 Chief Complaint:  Left Hip Pain History of Present Illness The patient is a 73 year old female who comes in for a preoperative history and physical. The patient is scheduled for a left total hip arthroplasty (anterior approach) to be performed by Dr. Dione Plover. Aluisio, MD at [facility] on 01/18/2014. The patient is a 73 year old female who presents with a hip problem. The patient reports left hip problems including pain and catching symptoms that have been present for 4 month(s). The symptoms began without any known injury. She reports that she has had left hip pain for the since February. She did not have nay injury or change in her acitvity. She reports that she initially saw her PCP who told her that she should see ortho. She has been seen by Dr. Gladstone Lighter. He initially treated her for bursitis. She did not have any relief with the cortisone injection. She did have an MRI which showed some moderate OA as well as questionable insufficiency fracture. No history of osteoporosis. She has never gotten any relief. She has pain all the time, but it is worse with weightbearing and activity such as getting in and out of the car. She is having pain radiate into the knee and at times down the calf. She is very frustrated because this is dictating every aspect of her life. No previous surgeries or injections in the left hip. Dominique Hawkins has been having trouble with her hip for many months now. Unfortunately it is getting progressively worse. Pain is in her groin and buttock and radiates down her thigh. It is hurting at all times including at night. It has become progressively worse over the past few months. She had an MRI from Dr. Gladstone Lighter back in March. She was told she had bursitis. She had been treated for bursitis. Unfortunately, the pain has become worse. She had x-rays at [facility] in June showing arthritis.  She states she was told that her MRI showed a fracture recently.  She said it has essentially taken over her life at this point. She would like to have some done.  She is ready to proceed with surgery. Risks and benefits of the surgery have been discussed with the patient and they elect to proceed with surgery.  There are on active contraindications to upcoming procedure such as ongoing infection or progressive neurological disease.  Allergies No Know Drug Allergies   Intolerances Codeine/Codeine Derivatives Nausea. Able to take Oxycodone if she eats food with it. TraMADol HCl *ANALGESICS - OPIOID* Nausea. light headedness  Problem List/Past Medical  Bursitis, hip (M70.70) Arthralgia of left hip (M25.552) Lumbar spine pain (724.2) Osteoarthritis of left hip (M16.12) Gastroesophageal Reflux Disease Irritable bowel syndrome Cataract Varicose veins Urinary Incontinence Menopause Measles Mumps Fibroma Hyperlipidemia Vitamin D Def  Family History Diabetes Mellitus Brother, Maternal Grandfather. Heart disease in female family member before age 39 First Degree Relatives reported Cancer Father, Mother. Heart Disease Paternal Grandmother.  Social History Tobacco use Never smoker. 06/05/2013 No history of drug/alcohol rehab Living situation live with spouse Current work status working full time Current drinker 06/05/2013: Currently drinks wine only occasionally per week Exercise does running / walking Children 2 Marital status married Post-Surgical Plans Looking into Rehab at [facility] or [facility]  Medication History  OxyCODONE HCl (5MG  Tablet, 1-2 Tablet Oral every six hours, as needed, Taken starting 01/07/2014) Active. Vitamin D3 (1000UNIT Capsule,  Oral) Active. Welchol (625MG  Tablet, Oral) Active.  Past Surgical History Hysterectomy partial (non-cancerous) Tubal Ligation Cataract Surgery bilateral Gallbladder Surgery  laporoscopic   Review of Systems  General Not Present- Chills, Fatigue, Fever, Memory Loss, Night Sweats, Weight Gain and Weight Loss. Skin Not Present- Eczema, Hives, Itching, Lesions and Rash. HEENT Not Present- Dentures, Double Vision, Headache, Hearing Loss, Tinnitus and Visual Loss. Respiratory Not Present- Allergies, Chronic Cough, Coughing up blood, Shortness of breath at rest and Shortness of breath with exertion. Cardiovascular Not Present- Chest Pain, Difficulty Breathing Lying Down, Murmur, Palpitations, Racing/skipping heartbeats and Swelling. Gastrointestinal Present- Diarrhea. Not Present- Abdominal Pain, Bloody Stool, Constipation, Difficulty Swallowing, Heartburn, Jaundice, Loss of appetitie, Nausea and Vomiting. Female Genitourinary Present- Urinating at Night. Not Present- Blood in Urine, Discharge, Flank Pain, Incontinence, Painful Urination, Urgency, Urinary frequency, Urinary Retention and Weak urinary stream. Musculoskeletal Present- Joint Pain and Muscle Pain. Not Present- Back Pain, Joint Swelling, Morning Stiffness, Muscle Weakness and Spasms. Neurological Not Present- Blackout spells, Difficulty with balance, Dizziness, Paralysis, Tremor and Weakness. Psychiatric Not Present- Insomnia.  Vitals  Weight: 153 lb Height: 62in Weight was reported by patient. Height was reported by patient. Body Surface Area: 1.74 m Body Mass Index: 27.98 kg/m  Pulse: 60 (Regular)  BP: 142/82 (Sitting, Right Arm, Standard)   Physical Exam General Mental Status -Alert, cooperative and good historian. General Appearance-pleasant, Not in acute distress. Orientation-Oriented X3. Build & Nutrition-Well nourished and Well developed.  Head and Neck Head-normocephalic, atraumatic . Neck Global Assessment - supple, no bruit auscultated on the right, no bruit auscultated on the left. Note: upper denture plate   Eye Pupil - Bilateral-Regular and Round. Motion -  Bilateral-EOMI.  Chest and Lung Exam Auscultation Breath sounds - clear at anterior chest wall and clear at posterior chest wall. Adventitious sounds - No Adventitious sounds.  Cardiovascular Auscultation Rhythm - Regular rate and rhythm. Heart Sounds - S1 WNL and S2 WNL. Murmurs & Other Heart Sounds - Auscultation of the heart reveals - No Murmurs.  Abdomen Palpation/Percussion Tenderness - Abdomen is non-tender to palpation. Rigidity (guarding) - Abdomen is soft. Auscultation Auscultation of the abdomen reveals - Bowel sounds normal.  Female Genitourinary Note: Not done, not pertinent to present illness   Musculoskeletal Note: Her left hip can be flexed to about 95 degrees, minimal internal rotation, only about 10 degrees of external rotation, and 30 degrees of abduction, but all ranges of motion cause significant pain. She has a significantly antalgic gait pattern using assistive devices.  RADIOGRAPHS I reviewed her plain x-rays from [facility] hospital from June and she did have significant joint space narrowing in the left hip. She also had some narrowing in the right hip also. I reviewed the MRI scan from March and she has pretty significant subchondral cystic changes in the femoral head and acetabulum and had an effusion at the time.  Radiographs from June show her joint space on the left had narrowed even further. She is within 2 mm of being completely bone on bone.  Assessment & Plan Osteoarthritis of left hip (M16.12)  Note:Plan is for Left Total Hip Replacement - Anterior Approach by Dr. Wynelle Link.  Plan is to go to inpatient rehab at [facility] or [facility].  PCP - Dr. Ward Givens  The patient does not have any contraindications and will receive TXA (tranexamic acid) prior to surgery.  Please note that the patient does get nauseated with anesthesia in the past.  Signed electronically by Joelene Millin, III  PA-C

## 2014-01-19 ENCOUNTER — Encounter (HOSPITAL_COMMUNITY): Payer: Self-pay | Admitting: Orthopedic Surgery

## 2014-01-19 LAB — BASIC METABOLIC PANEL
Anion gap: 11 (ref 5–15)
BUN: 12 mg/dL (ref 6–23)
CO2: 22 meq/L (ref 19–32)
Calcium: 8 mg/dL — ABNORMAL LOW (ref 8.4–10.5)
Chloride: 100 mEq/L (ref 96–112)
Creatinine, Ser: 0.65 mg/dL (ref 0.50–1.10)
GFR calc Af Amer: >90 mL/min (ref >90)
GFR, EST NON AFRICAN AMERICAN: 86 mL/min — AB (ref >90)
GLUCOSE: 192 mg/dL — AB (ref 70–99)
POTASSIUM: 3.9 meq/L (ref 3.7–5.3)
SODIUM: 133 meq/L — AB (ref 137–147)

## 2014-01-19 LAB — CBC
HCT: 32.5 % — ABNORMAL LOW (ref 36.0–46.0)
Hemoglobin: 10.8 g/dL — ABNORMAL LOW (ref 12.0–15.0)
MCH: 27.1 pg (ref 26.0–34.0)
MCHC: 33.2 g/dL (ref 30.0–36.0)
MCV: 81.7 fL (ref 78.0–100.0)
Platelets: 161 10*3/uL (ref 150–400)
RBC: 3.98 MIL/uL (ref 3.87–5.11)
RDW: 13.8 % (ref 11.5–15.5)
WBC: 9.2 10*3/uL (ref 4.0–10.5)

## 2014-01-19 MED ORDER — DSS 100 MG PO CAPS: 100.0000 mg | ORAL_CAPSULE | Freq: Two times a day (BID) | ORAL | Status: DC

## 2014-01-19 MED ORDER — RIVAROXABAN 10 MG PO TABS: 10.0000 mg | ORAL_TABLET | Freq: Every day | ORAL | Status: DC

## 2014-01-19 MED ORDER — SODIUM CHLORIDE 0.9 % IV BOLUS (SEPSIS)
250.0000 mL | Freq: Once | INTRAVENOUS | Status: AC
  Administered 2014-01-19: 250 mL via INTRAVENOUS

## 2014-01-19 MED ORDER — METHOCARBAMOL 500 MG PO TABS: 500.0000 mg | ORAL_TABLET | Freq: Four times a day (QID) | ORAL | Status: DC | PRN

## 2014-01-19 MED ORDER — ACETAMINOPHEN 325 MG PO TABS: 650.0000 mg | ORAL_TABLET | Freq: Four times a day (QID) | ORAL | Status: DC | PRN

## 2014-01-19 MED ORDER — HYDROMORPHONE HCL 2 MG PO TABS: 2.0000 mg | ORAL_TABLET | ORAL | Status: DC | PRN

## 2014-01-19 MED ORDER — POLYETHYLENE GLYCOL 3350 17 G PO PACK: 17.0000 g | PACK | Freq: Every day | ORAL | Status: DC | PRN

## 2014-01-19 MED ORDER — METOCLOPRAMIDE HCL 5 MG PO TABS: 5.0000 mg | ORAL_TABLET | Freq: Three times a day (TID) | ORAL | Status: DC | PRN

## 2014-01-19 MED ORDER — TRAMADOL HCL 50 MG PO TABS: 50.0000 mg | ORAL_TABLET | Freq: Four times a day (QID) | ORAL | Status: DC | PRN

## 2014-01-19 MED ORDER — ONDANSETRON HCL 4 MG PO TABS: 4.0000 mg | ORAL_TABLET | Freq: Four times a day (QID) | ORAL | Status: DC | PRN

## 2014-01-19 MED ORDER — COLESEVELAM HCL 625 MG PO TABS
1250.0000 mg | ORAL_TABLET | ORAL | Status: DC
  Administered 2014-01-19 – 2014-01-20 (×2): 1250 mg via ORAL
  Filled 2014-01-19 (×3): qty 2

## 2014-01-19 MED ORDER — BISACODYL 10 MG RE SUPP
10.0000 mg | Freq: Every day | RECTAL | Status: DC | PRN
Start: 2014-01-19 — End: 2018-12-25

## 2014-01-19 MED ORDER — OXYCODONE HCL 5 MG PO CAPS: 5.0000 mg | ORAL_CAPSULE | ORAL | Status: DC | PRN

## 2014-01-19 NOTE — Progress Notes (Signed)
Utilization review completed.  

## 2014-01-19 NOTE — Evaluation (Signed)
Physical Therapy Evaluation Patient Details Name: Dominique Hawkins MRN: [id number] DOB: 1941/03/18 Today's Date: 01/19/2014   History of Present Illness  73 yo female s/p L THA-direct anterior 01/18/14.   Clinical Impression  On eval, pt required Min assist for mobility-able to ambulate 75 feet with rolling walker. Tolerated activity well. Pt states plan is for ST rehab for continued therapy to regain independence with functional mobility.     Follow Up Recommendations SNF    Equipment Recommendations  None recommended by PT    Recommendations for Other Services OT consult     Precautions / Restrictions Precautions Precautions: Fall Restrictions Weight Bearing Restrictions: No LLE Weight Bearing: Weight bearing as tolerated      Mobility  Bed Mobility Overal bed mobility: Needs Assistance Bed Mobility: Supine to Sit     Supine to sit: Min assist     General bed mobility comments: Assist for L LE  Transfers Overall transfer level: Needs assistance Equipment used: Rolling walker (2 wheeled) Transfers: Sit to/from Stand Sit to Stand: Min assist         General transfer comment: assist to rise, stabilize, control descent. VCS safety, technique, hand placment  Ambulation/Gait Ambulation/Gait assistance: Min assist Ambulation Distance (Feet): 75 Feet Assistive device: Rolling walker (2 wheeled) Gait Pattern/deviations: Step-through pattern;Decreased stride length     General Gait Details: assist to stabilize throughout ambulation. tolerated distance fairly well. VCs sfaety, technique, sequence. Pt transitioned to reciprocal gait pattern as distance progressed  Financial trader Rankin (Stroke Patients Only)       Balance                                             Pertinent Vitals/Pain Pain Assessment: 0-10 Pain Score: 6  Pain Location: L hip, thigh area Pain Intervention(s): Monitored during  session    Home Living Family/patient expects to be discharged to:: Skilled nursing facility Living Arrangements: Spouse/significant other   Type of Home:  (townhome) Home Access: Level entry;Stairs to enter   Entrance Stairs-Number of Steps: 1 step.  Home Layout: One level Home Equipment: Walker - 2 wheels;Cane - single point      Prior Function Level of Independence: Independent with assistive device(s)         Comments: using cane before surgery     Hand Dominance        Extremity/Trunk Assessment   Upper Extremity Assessment: Overall WFL for tasks assessed           Lower Extremity Assessment: LLE deficits/detail   LLE Deficits / Details: Strength  at least : hip flex 2/5, hip abd 2/5, knee ext 3/5, moves ankle well  Cervical / Trunk Assessment: Normal  Communication   Communication: No difficulties  Cognition Arousal/Alertness: Awake/alert Behavior During Therapy: WFL for tasks assessed/performed Overall Cognitive Status: Within Functional Limits for tasks assessed                      General Comments      Exercises Total Joint Exercises Ankle Circles/Pumps: AROM;Both;15 reps;Seated Quad Sets: AROM;Both;15 reps;Seated Heel Slides: AAROM;Left;10 reps;Seated Hip ABduction/ADduction: AAROM;Left;10 reps;Seated Long Arc Quad: AROM;Left;10 reps;Seated      Assessment/Plan    PT Assessment Patient needs continued PT services  PT Diagnosis Difficulty walking;Acute  pain   PT Problem List Decreased strength;Decreased range of motion;Decreased activity tolerance;Decreased balance;Decreased mobility;Decreased knowledge of use of DME;Pain  PT Treatment Interventions DME instruction;Gait training;Functional mobility training;Therapeutic activities;Therapeutic exercise;Patient/family education;Balance training   PT Goals (Current goals can be found in the Care Plan section) Acute Rehab PT Goals Patient Stated Goal: snf for rehab. regain  independence PT Goal Formulation: With patient Time For Goal Achievement: 01/26/14 Potential to Achieve Goals: Good    Frequency 7X/week   Barriers to discharge        Co-evaluation               End of Session   Activity Tolerance: Patient tolerated treatment well Patient left: in chair;with call bell/phone within reach           Time: 0953-1007 PT Time Calculation (min): 14 min   Charges:   PT Evaluation $Initial PT Evaluation Tier I: 1 Procedure PT Treatments $Gait Training: 8-22 mins   PT G Codes:          Weston Anna, MPT Pager: [phone]

## 2014-01-19 NOTE — Progress Notes (Signed)
Physical Therapy Treatment Patient Details Name: Dominique Hawkins MRN: [id number] DOB: 09-Nov-1940 Today's Date: 01/19/2014    History of Present Illness 73 yo female s/p L THA-direct anterior 01/18/14.     PT Comments    Progressing with mobility. Plan is for SNF  Follow Up Recommendations  SNF     Equipment Recommendations  None recommended by PT    Recommendations for Other Services OT consult     Precautions / Restrictions Precautions Precautions: Fall Restrictions Weight Bearing Restrictions: No LLE Weight Bearing: Weight bearing as tolerated    Mobility  Bed Mobility Overal bed mobility: Needs Assistance Bed Mobility: Supine to Sit;Sit to Supine     Supine to sit: Min assist Sit to supine: Min assist   General bed mobility comments: Assist for L LE  Transfers Overall transfer level: Needs assistance Equipment used: Rolling walker (2 wheeled) Transfers: Sit to/from Stand Sit to Stand: Min guard         General transfer comment:  VCS safety, technique, hand placment. close guard for safety  Ambulation/Gait Ambulation/Gait assistance: Min guard Ambulation Distance (Feet): 115 Feet Assistive device: Rolling walker (2 wheeled) Gait Pattern/deviations: Step-through pattern;Step-to pattern;Decreased stride length;Antalgic;Trunk flexed     General Gait Details: tolerated distance fairly well. VCs sfaety, technique, sequence. Pt transitioned to reciprocal gait pattern as distance progressed   Financial trader Rankin (Stroke Patients Only)       Balance                                    Cognition Arousal/Alertness: Awake/alert Behavior During Therapy: WFL for tasks assessed/performed Overall Cognitive Status: Within Functional Limits for tasks assessed                      Exercises      General Comments        Pertinent Vitals/Pain Pain Assessment: 0-10 Pain Score: 7  Pain  Location: L hip, thigh area Pain Descriptors / Indicators: Burning;Sore Pain Intervention(s): Monitored during session;Ice applied    Home Living                      Prior Function            PT Goals (current goals can now be found in the care plan section) Progress towards PT goals: Progressing toward goals    Frequency  7X/week    PT Plan Current plan remains appropriate    Co-evaluation             End of Session   Activity Tolerance: Patient tolerated treatment well Patient left: in bed;with call bell/phone within reach     Time: 5638-9373 PT Time Calculation (min): 10 min  Charges:  $Gait Training: 8-22 mins                    G Codes:      Weston Anna, MPT Pager: [phone]

## 2014-01-19 NOTE — Discharge Instructions (Addendum)
Dr. Gaynelle Arabian Total Joint Specialist [facility] [address] [place], [place] [address] [phone]    ANTERIOR APPROACH TOTAL HIP REPLACEMENT POSTOPERATIVE DIRECTIONS   Hip Rehabilitation, Guidelines Following Surgery  The results of a hip operation are greatly improved after range of motion and muscle strengthening exercises. Follow all safety measures which are given to protect your hip. If any of these exercises cause increased pain or swelling in your joint, decrease the amount until you are comfortable again. Then slowly increase the exercises. Call your caregiver if you have problems or questions.  HOME CARE INSTRUCTIONS  Most of the following instructions are designed to prevent the dislocation of your new hip.  Remove items at home which could result in a fall. This includes throw rugs or furniture in walking pathways.  Continue medications as instructed at time of discharge.  You may have some home medications which will be placed on hold until you complete the course of blood thinner medication.  You may start showering once you are discharged home but do not submerge the incision under water. Just pat the incision dry and apply a dry gauze dressing on daily. Do not put on socks or shoes without following the instructions of your caregivers.  Sit on high chairs which makes it easier to stand.  Sit on chairs with arms. Use the chair arms to help push yourself up when arising.  Keep your leg on the side of the operation out in front of you when standing up.  Arrange for the use of a toilet seat elevator so you are not sitting low.    Walk with walker as instructed.  You may resume a sexual relationship in one month or when given the OK by your caregiver.  Use walker as long as suggested by your caregivers.  You may put full weight on your legs and walk as much as is comfortable. Avoid periods of inactivity such as sitting longer than an hour  when not asleep. This helps prevent blood clots.  You may return to work once you are cleared by Engineer, production.  Do not drive a car for 6 weeks or until released by your surgeon.  Do not drive while taking narcotics.  Wear elastic stockings for three weeks following surgery during the day but you may remove then at night.  Make sure you keep all of your appointments after your operation with all of your doctors and caregivers. You should call the office at the above phone number and make an appointment for approximately two weeks after the date of your surgery. Change the dressing daily and reapply a dry dressing each time. Please pick up a stool softener and laxative for home use as long as you are requiring pain medications.  Continue to use ice on the hip for pain and swelling from surgery. You may notice swelling that will progress down to the foot and ankle.  This is normal after surgery.  Elevate the leg when you are not up walking on it.   It is important for you to complete the blood thinner medication as prescribed by your doctor.  Continue to use the breathing machine which will help keep your temperature down.  It is common for your temperature to cycle up and down following surgery, especially at night when you are not up moving around and exerting yourself.  The breathing machine keeps your lungs expanded and your temperature down.  RANGE OF MOTION AND STRENGTHENING EXERCISES  These exercises are designed to help you keep full movement of your hip joint. Follow your caregiver's or physical therapist's instructions. Perform all exercises about fifteen times, three times per day or as directed. Exercise both hips, even if you have had only one joint replacement. These exercises can be done on a training (exercise) mat, on the floor, on a table or on a bed. Use whatever works the best and is most comfortable for you. Use music or television while you are exercising so that the exercises are a  pleasant break in your day. This will make your life better with the exercises acting as a break in routine you can look forward to.  Lying on your back, slowly slide your foot toward your buttocks, raising your knee up off the floor. Then slowly slide your foot back down until your leg is straight again.  Lying on your back spread your legs as far apart as you can without causing discomfort.  Lying on your side, raise your upper leg and foot straight up from the floor as far as is comfortable. Slowly lower the leg and repeat.  Lying on your back, tighten up the muscle in the front of your thigh (quadriceps muscles). You can do this by keeping your leg straight and trying to raise your heel off the floor. This helps strengthen the largest muscle supporting your knee.  Lying on your back, tighten up the muscles of your buttocks both with the legs straight and with the knee bent at a comfortable angle while keeping your heel on the floor.   SKILLED REHAB INSTRUCTIONS: If the patient is transferred to a skilled rehab facility following release from the hospital, a list of the current medications will be sent to the facility for the patient to continue.  When discharged from the skilled rehab facility, please have the facility set up the patient's [place] prior to being released. Also, the skilled facility will be responsible for providing the patient with their medications at time of release from the facility to include their pain medication, the muscle relaxants, and their blood thinner medication. If the patient is still at the rehab facility at time of the two week follow up appointment, the skilled rehab facility will also need to assist the patient in arranging follow up appointment in our office and any transportation needs.  MAKE SURE YOU:  Understand these instructions.  Will watch your condition.  Will get help right away if you are not doing well or get worse.  Pick up  stool softner and laxative for home. Do not submerge incision under water. May shower. Continue to use ice for pain and swelling from surgery. Total Hip Protocol.  Take Xarelto for two and a half more weeks, then discontinue Xarelto. Once the patient has completed the blood thinner regimen, then take a Baby 81 mg Aspirin daily for three more weeks.  When discharged from the skilled rehab facility, please have the facility set up the patient's [place] prior to being released.  Also provide the patient with their medications at time of release from the facility to include their pain medication, the muscle relaxants, and their blood thinner medication.  If the patient is still at the rehab facility at time of follow up appointment, please also assist the patient in arranging follow up appointment in our office and any transportation needs.   Information on my medicine - XARELTO (Rivaroxaban)  This medication education was reviewed with  me or my healthcare representative as part of my discharge preparation.  The pharmacist that spoke with me during my hospital stay was:  Julio Sicks, [facility]  Why was Xarelto prescribed for you? Xarelto was prescribed for you to reduce the risk of blood clots forming after orthopedic surgery. The medical term for these abnormal blood clots is venous thromboembolism (VTE).  What do you need to know about xarelto ? Take your Xarelto ONCE DAILY at the same time every day. You may take it either with or without food.  If you have difficulty swallowing the tablet whole, you may crush it and mix in applesauce just prior to taking your dose.  Take Xarelto exactly as prescribed by your doctor and DO NOT stop taking Xarelto without talking to the doctor who prescribed the medication.  Stopping without other VTE prevention medication to take the place of Xarelto may increase your risk of developing a clot.  After discharge, you should have  regular check-up appointments with your healthcare provider that is prescribing your Xarelto.    What do you do if you miss a dose? If you miss a dose, take it as soon as you remember on the same day then continue your regularly scheduled once daily regimen the next day. Do not take two doses of Xarelto on the same day.   Important Safety Information A possible side effect of Xarelto is bleeding. You should call your healthcare provider right away if you experience any of the following:   Bleeding from an injury or your nose that does not stop.   Unusual colored urine (red or dark brown) or unusual colored stools (red or black).   Unusual bruising for unknown reasons.   A serious fall or if you hit your head (even if there is no bleeding).  Some medicines may interact with Xarelto and might increase your risk of bleeding while on Xarelto. To help avoid this, consult your healthcare provider or pharmacist prior to using any new prescription or non-prescription medications, including herbals, vitamins, non-steroidal anti-inflammatory drugs (NSAIDs) and supplements.  This website has more information on Xarelto: [email or web address]

## 2014-01-19 NOTE — Progress Notes (Signed)
OT Cancellation Note  Patient Details Name: RAENETTE SAKATA MRN: [id number] DOB: 08-Jun-1940   Cancelled Treatment:    Reason Eval/Treat Not Completed: Other (comment) Pt is Medicare and current D/C plan is SNF. No apparent immediate acute care OT needs, therefore will defer OT to SNF. If OT eval is needed please call Acute Rehab Dept. at [phone] or text page OT at [phone].  [place], OTR/L  [phone] 01/19/2014 01/19/2014, 2:00 PM

## 2014-01-19 NOTE — Discharge Summary (Signed)
Physician Discharge Summary   Patient ID: AFIA MESSENGER MRN: [id number] DOB/AGE: 24-Nov-1940 73 y.o.  Admit date: 01/18/2014 Discharge date: 01/20/2014  Primary Diagnosis:  Osteoarthritis of the Left hip.   Admission Diagnoses:  Past Medical History  Diagnosis Date  . Venous hypertension     varicose veins  . Hyperlipidemia   . GERD (gastroesophageal reflux disease)   . Osteoporosis   . Prediabetes   . IBS (irritable bowel syndrome)   . Complication of anesthesia   . PONV (postoperative nausea and vomiting)   . Fibromyalgia   . Difficulty sleeping     DUE TO PAIN   Discharge Diagnoses:   Principal Problem:   OA (osteoarthritis) of hip  Estimated body mass index is 28.53 kg/(m^2) as calculated from the following:   Height as of this encounter: _0  (1.575 m).   Weight as of this encounter: 70.761 kg (156 lb).  Procedure(s) (LRB): LEFT TOTAL HIP ARTHROPLASTY ANTERIOR APPROACH (Left)   Consults: None  HPI: Dominique Hawkins is a 73 y.o. female who has advanced end-  stage arthritis of her Left hip with progressively worsening pain and  dysfunction.The patient has failed nonoperative management and presents for  total hip arthroplasty.   Laboratory Data: Admission on 01/18/2014  Component Date Value Ref Range Status  . WBC 01/19/2014 9.2  4.0 - 10.5 K/uL Final  . RBC 01/19/2014 3.98  3.87 - 5.11 MIL/uL Final  . Hemoglobin 01/19/2014 10.8* 12.0 - 15.0 g/dL Final  . HCT 01/19/2014 32.5* 36.0 - 46.0 % Final  . MCV 01/19/2014 81.7  78.0 - 100.0 fL Final  . MCH 01/19/2014 27.1  26.0 - 34.0 pg Final  . MCHC 01/19/2014 33.2  30.0 - 36.0 g/dL Final  . RDW 01/19/2014 13.8  11.5 - 15.5 % Final  . Platelets 01/19/2014 161  150 - 400 K/uL Final  . Sodium 01/19/2014 133* 137 - 147 mEq/L Final  . Potassium 01/19/2014 3.9  3.7 - 5.3 mEq/L Final  . Chloride 01/19/2014 100  96 - 112 mEq/L Final  . CO2 01/19/2014 22  19 - 32 mEq/L Final  . Glucose, Bld 01/19/2014 192* 70 - 99 mg/dL  Final  . BUN 01/19/2014 12  6 - 23 mg/dL Final  . Creatinine, Ser 01/19/2014 0.65  0.50 - 1.10 mg/dL Final  . Calcium 01/19/2014 8.0* 8.4 - 10.5 mg/dL Final  . GFR calc non Af Amer 01/19/2014 86* >90 mL/min Final  . GFR calc Af Amer 01/19/2014 >90  >90 mL/min Final   Comment: (NOTE)                          The eGFR has been calculated using the CKD EPI equation.                          This calculation has not been validated in all clinical situations.                          eGFR's persistently <90 mL/min signify possible Chronic Kidney                          Disease.  . Anion gap 01/19/2014 11  5 - 15 Final  . WBC 01/20/2014 9.8  4.0 - 10.5 K/uL Final  . RBC 01/20/2014 3.65* 3.87 - 5.11 MIL/uL Final  .  Hemoglobin 01/20/2014 10.1* 12.0 - 15.0 g/dL Final  . HCT 01/20/2014 29.9* 36.0 - 46.0 % Final  . MCV 01/20/2014 81.9  78.0 - 100.0 fL Final  . MCH 01/20/2014 27.7  26.0 - 34.0 pg Final  . MCHC 01/20/2014 33.8  30.0 - 36.0 g/dL Final  . RDW 01/20/2014 14.4  11.5 - 15.5 % Final  . Platelets 01/20/2014 175  150 - 400 K/uL Final  . Sodium 01/20/2014 139  137 - 147 mEq/L Final  . Potassium 01/20/2014 4.1  3.7 - 5.3 mEq/L Final  . Chloride 01/20/2014 106  96 - 112 mEq/L Final  . CO2 01/20/2014 21  19 - 32 mEq/L Final  . Glucose, Bld 01/20/2014 164* 70 - 99 mg/dL Final  . BUN 01/20/2014 13  6 - 23 mg/dL Final  . Creatinine, Ser 01/20/2014 0.59  0.50 - 1.10 mg/dL Final  . Calcium 01/20/2014 8.3* 8.4 - 10.5 mg/dL Final  . GFR calc non Af Amer 01/20/2014 89* >90 mL/min Final  . GFR calc Af Amer 01/20/2014 >90  >90 mL/min Final   Comment: (NOTE)                          The eGFR has been calculated using the CKD EPI equation.                          This calculation has not been validated in all clinical situations.                          eGFR's persistently <90 mL/min signify possible Chronic Kidney                          Disease.  Dominique Hawkins gap 01/20/2014 12  5 - 15 Final    Hospital Outpatient Visit on 01/14/2014  Component Date Value Ref Range Status  . aPTT 01/14/2014 32  24 - 37 seconds Final  . WBC 01/14/2014 4.4  4.0 - 10.5 K/uL Final  . RBC 01/14/2014 4.75  3.87 - 5.11 MIL/uL Final  . Hemoglobin 01/14/2014 13.1  12.0 - 15.0 g/dL Final  . HCT 01/14/2014 38.5  36.0 - 46.0 % Final  . MCV 01/14/2014 81.1  78.0 - 100.0 fL Final  . MCH 01/14/2014 27.6  26.0 - 34.0 pg Final  . MCHC 01/14/2014 34.0  30.0 - 36.0 g/dL Final  . RDW 01/14/2014 13.8  11.5 - 15.5 % Final  . Platelets 01/14/2014 184  150 - 400 K/uL Final  . Sodium 01/14/2014 140  137 - 147 mEq/L Final  . Potassium 01/14/2014 4.1  3.7 - 5.3 mEq/L Final  . Chloride 01/14/2014 103  96 - 112 mEq/L Final  . CO2 01/14/2014 25  19 - 32 mEq/L Final  . Glucose, Bld 01/14/2014 98  70 - 99 mg/dL Final  . BUN 01/14/2014 11  6 - 23 mg/dL Final  . Creatinine, Ser 01/14/2014 0.70  0.50 - 1.10 mg/dL Final  . Calcium 01/14/2014 8.9  8.4 - 10.5 mg/dL Final  . Total Protein 01/14/2014 6.9  6.0 - 8.3 g/dL Final  . Albumin 01/14/2014 3.9  3.5 - 5.2 g/dL Final  . AST 01/14/2014 14  0 - 37 U/L Final  . ALT 01/14/2014 7  0 - 35 U/L Final  . Alkaline Phosphatase 01/14/2014 79  39 - 117 U/L  Final  . Total Bilirubin 01/14/2014 0.3  0.3 - 1.2 mg/dL Final  . GFR calc non Af Amer 01/14/2014 84* >90 mL/min Final  . GFR calc Af Amer 01/14/2014 >90  >90 mL/min Final   Comment: (NOTE)                          The eGFR has been calculated using the CKD EPI equation.                          This calculation has not been validated in all clinical situations.                          eGFR's persistently <90 mL/min signify possible Chronic Kidney                          Disease.  . Anion gap 01/14/2014 12  5 - 15 Final  . Prothrombin Time 01/14/2014 13.4  11.6 - 15.2 seconds Final  . INR 01/14/2014 1.01  0.00 - 1.49 Final  . ABO/RH(D) 01/14/2014 AB POS   Final  . Antibody Screen 01/14/2014 NEG   Final  . Sample Expiration  01/14/2014 01/21/2014   Final  . Color, Urine 01/14/2014 YELLOW  YELLOW Final  . APPearance 01/14/2014 CLEAR  CLEAR Final  . Specific Gravity, Urine 01/14/2014 1.020  1.005 - 1.030 Final  . pH 01/14/2014 5.0  5.0 - 8.0 Final  . Glucose, UA 01/14/2014 NEGATIVE  NEGATIVE mg/dL Final  . Hgb urine dipstick 01/14/2014 NEGATIVE  NEGATIVE Final  . Bilirubin Urine 01/14/2014 NEGATIVE  NEGATIVE Final  . Ketones, ur 01/14/2014 NEGATIVE  NEGATIVE mg/dL Final  . Protein, ur 01/14/2014 NEGATIVE  NEGATIVE mg/dL Final  . Urobilinogen, UA 01/14/2014 0.2  0.0 - 1.0 mg/dL Final  . Nitrite 01/14/2014 NEGATIVE  NEGATIVE Final  . Leukocytes, UA 01/14/2014 SMALL* NEGATIVE Final  . MRSA, PCR 01/14/2014 NEGATIVE  NEGATIVE Final  . Staphylococcus aureus 01/14/2014 NEGATIVE  NEGATIVE Final   Comment:                                 The Xpert SA Assay (FDA                          approved for NASAL specimens                          in patients over 20 years of age),                          is one component of                          a comprehensive surveillance                          program.  Test performance has                          been validated by [employer] for patients greater                          than or equal to 9 year old.                          It is not intended                          to diagnose infection nor to                          guide or monitor treatment.  . ABO/RH(D) 01/14/2014 AB POS   Final  . Squamous Epithelial / LPF 01/14/2014 RARE  RARE Final  . WBC, UA 01/14/2014 3-6  <3 WBC/hpf Final  . RBC / HPF 01/14/2014 0-2  <3 RBC/hpf Final  . Bacteria, UA 01/14/2014 FEW* RARE Final     X-Rays:Dg Hip Complete Left  01/14/2014   CLINICAL DATA:  Preop for left hip surgery scheduled for 01/18/2014, left hip arthritis  EXAM: LEFT HIP - COMPLETE 2+ VIEW  COMPARISON:  None.  FINDINGS: Pelvic bones are intact. Mild to moderate bilateral hip joint  space narrowing and mild bilateral osteophyte formation. No fracture dislocation or other significant focal osseous abnormality.  IMPRESSION: Bilateral hip arthritis   Electronically Signed   By: Skipper Cliche M.D.   On: 01/14/2014 17:14   Dg Pelvis Portable  01/18/2014   CLINICAL DATA:  Postop left hip replacement.  Subsequent encounter  EXAM: PORTABLE LEFT HIP - 1 VIEW; PORTABLE PELVIS 1-2 VIEWS  COMPARISON:  01/14/2014  FINDINGS: New total left hip arthroplasty. No periprosthetic fracture or dislocation. Surgical drain subcutaneous gas noted.  IMPRESSION: Total left hip arthroplasty.  No acute findings.   Electronically Signed   By: Jorje Guild M.D.   On: 01/18/2014 18:06   Dg Hip Portable 1 View Left  01/18/2014   CLINICAL DATA:  Postop left hip replacement.  Subsequent encounter  EXAM: PORTABLE LEFT HIP - 1 VIEW; PORTABLE PELVIS 1-2 VIEWS  COMPARISON:  01/14/2014  FINDINGS: New total left hip arthroplasty. No periprosthetic fracture or dislocation. Surgical drain subcutaneous gas noted.  IMPRESSION: Total left hip arthroplasty.  No acute findings.   Electronically Signed   By: Jorje Guild M.D.   On: 01/18/2014 18:06   Dg C-arm 1-60 Min-no Report  01/18/2014   CLINICAL DATA: left anterior hip   C-ARM 1-60 MINUTES  Fluoroscopy was utilized by the requesting physician.  No radiographic  interpretation.     EKG:No orders found for this or any previous visit.   Hospital Course: Patient was admitted to [facility] and taken to the OR and underwent the above state procedure without complications.  Patient tolerated the procedure well and was later transferred to the recovery room and then to the orthopaedic floor for postoperative care.  They were given PO and IV analgesics for pain control following their surgery.  They were given 24 hours of postoperative antibiotics of  Anti-infectives   Start     Dose/Rate Route Frequency Ordered Stop   01/19/14 0600  ceFAZolin (ANCEF) IVPB 2  g/50 mL premix     2 g 100 mL/hr over 30 Minutes Intravenous On call to O.R. 01/18/14 1231 01/18/14 1511   01/18/14 2100  ceFAZolin (ANCEF) IVPB 2 g/50 mL premix  2 g 100 mL/hr over 30 Minutes Intravenous Every 6 hours 01/18/14 1900 01/19/14 0336     and started on DVT prophylaxis in the form of Xarelto.   PT and OT were ordered for total hip protocol.  The patient was allowed to be WBAT with therapy. Discharge planning was consulted to help with postop disposition and equipment needs.  Patient had a good night on the evening of surgery.  They started to get up OOB with therapy on day one.  Hemovac drain was pulled without difficulty.  Continued to work with therapy into day two.  Dressing was changed on day two and the incision was healing well.  Patient was seen in rounds and was ready to go to the SNF.  Discharge to SNF  Diet - Regular diet  Follow up - in 2 weeks  Activity - WBAT  Disposition - Skilled nursing facility  Condition Upon Discharge - Good  D/C Meds - See DC Summary  DVT Prophylaxis - Xarelto       Discharge Instructions   Call MD / Call 911    Complete by:  As directed   If you experience chest pain or shortness of breath, CALL 911 and be transported to the hospital emergency room.  If you develope a fever above 101 F, pus (white drainage) or increased drainage or redness at the wound, or calf pain, call your surgeon's office.     Change dressing    Complete by:  As directed   You may change your dressing dressing daily with sterile 4 x 4 inch gauze dressing and paper tape.  Do not submerge the incision under water.     Constipation Prevention    Complete by:  As directed   Drink plenty of fluids.  Prune juice may be helpful.  You may use a stool softener, such as Colace (over the counter) 100 mg twice a day.  Use MiraLax (over the counter) for constipation as needed.     Diet general    Complete by:  As directed      Discharge instructions    Complete by:  As  directed   Pick up stool softner and laxative for home. Do not submerge incision under water. May shower. Continue to use ice for pain and swelling from surgery.  Total Hip Protocol.  Take Xarelto for two and a half more weeks, then discontinue Xarelto. Once the patient has completed the blood thinner regimen, then take a Baby 81 mg Aspirin daily for three more weeks.  When discharged from the skilled rehab facility, please have the facility set up the patient's [place] prior to being released.  Also provide the patient with their medications at time of release from the facility to include their pain medication, the muscle relaxants, and their blood thinner medication.  If the patient is still at the rehab facility at time of follow up appointment, please also assist the patient in arranging follow up appointment in our office and any transportation needs.     Do not sit on low chairs, stoools or toilet seats, as it may be difficult to get up from low surfaces    Complete by:  As directed      Driving restrictions    Complete by:  As directed   No driving until released by the physician.     Increase activity slowly as tolerated    Complete by:  As directed      Lifting  restrictions    Complete by:  As directed   No lifting until released by the physician.     Patient may shower    Complete by:  As directed   You may shower without a dressing once there is no drainage.  Do not wash over the wound.  If drainage remains, do not shower until drainage stops.     TED hose    Complete by:  As directed   Use stockings (TED hose) for 3 weeks on both leg(s).  You may remove them at night for sleeping.     Weight bearing as tolerated    Complete by:  As directed             Medication List    STOP taking these medications       cholecalciferol 1000 UNITS tablet  Commonly known as:  VITAMIN D     fish oil-omega-3 fatty acids 1000 MG capsule      TAKE these  medications       acetaminophen 325 MG tablet  Commonly known as:  TYLENOL  Take 2 tablets (650 mg total) by mouth every 6 (six) hours as needed for mild pain (or Fever >/= 101).     bisacodyl 10 MG suppository  Commonly known as:  DULCOLAX  Place 1 suppository (10 mg total) rectally daily as needed for moderate constipation.     colesevelam 625 MG tablet  Commonly known as:  WELCHOL  Take 1,250 mg by mouth daily.     DSS 100 MG Caps  Take 100 mg by mouth 2 (two) times daily.     HYDROmorphone 2 MG tablet  Commonly known as:  DILAUDID  Take 1-2 tablets (2-4 mg total) by mouth every 4 (four) hours as needed for moderate pain or severe pain.     methocarbamol 500 MG tablet  Commonly known as:  ROBAXIN  Take 1 tablet (500 mg total) by mouth every 6 (six) hours as needed for muscle spasms.     metoCLOPramide 5 MG tablet  Commonly known as:  REGLAN  Take 1-2 tablets (5-10 mg total) by mouth every 8 (eight) hours as needed for nausea (if ondansetron (ZOFRAN) ineffective.).     ondansetron 4 MG tablet  Commonly known as:  ZOFRAN  Take 1 tablet (4 mg total) by mouth every 6 (six) hours as needed for nausea.     oxycodone 5 MG capsule  Commonly known as:  OXY-IR  Take 1 capsule (5 mg total) by mouth every 4 (four) hours as needed for pain.     pantoprazole 40 MG tablet  Commonly known as:  PROTONIX  Take 40 mg by mouth daily as needed (for acid reflux).     polyethylene glycol packet  Commonly known as:  MIRALAX / GLYCOLAX  Take 17 g by mouth daily as needed for mild constipation.     rivaroxaban 10 MG Tabs tablet  Commonly known as:  XARELTO  - Take 1 tablet (10 mg total) by mouth daily with breakfast. Take Xarelto for two and a half more weeks, then discontinue Xarelto.  - Once the patient has completed the blood thinner regimen, then take a Baby 81 mg Aspirin daily for three more weeks.     traMADol 50 MG tablet  Commonly known as:  ULTRAM  Take 1-2 tablets (50-100 mg  total) by mouth every 6 (six) hours as needed (mild pain).       Follow-up Information   Follow  up with Gearlean Alf, MD In 2 weeks. (Call office at [phone] to setup follow up appointment.)    Specialty:  Orthopedic Surgery   Contact information:   [address] [place] [address] [phone]       Signed: Arlee Muslim, PA-C Orthopaedic Surgery 01/20/2014, 11:54 AM

## 2014-01-19 NOTE — Progress Notes (Signed)
Nutrition Brief Note  Patient identified on the Malnutrition Screening Tool (MST) Report  Wt Readings from Last 15 Encounters:  01/18/14 156 lb (70.761 kg)  01/18/14 156 lb (70.761 kg)  01/14/14 156 lb (70.761 kg)  09/05/13 153 lb (69.4 kg)  02/27/11 154 lb 6.4 oz (70.035 kg)  02/12/11 153 lb (69.4 kg)  02/08/11 153 lb (69.4 kg)  12/13/10 151 lb (68.493 kg)  11/16/10 154 lb (69.854 kg)    Body mass index is 28.53 kg/(m^2). Patient meets criteria for overweight based on current BMI.   Current diet order is regular, patient is consuming approximately 100% of meals at this time. Labs and medications reviewed.   Patient reports 5 lb weight loss due to decreased appetite with pain meds.  Per e chart weights, it appears that weight has been regained.  Discussed the importance of healthy eating and preventing weight gain.  No nutrition interventions warranted at this time. If nutrition issues arise, please consult RD.   Antonieta Iba, RD, LDN Clinical Inpatient Dietitian Pager:  [phone] Weekend and after hours pager:  [phone]

## 2014-01-19 NOTE — Progress Notes (Signed)
Clinical Social Work Department CLINICAL SOCIAL WORK PLACEMENT NOTE 01/19/2014  Patient:  Dominique Hawkins, Dominique Hawkins  Account Number:  [id number] Admit date:  01/18/2014  Clinical Social Worker:  Werner Lean, LCSW  Date/time:  01/19/2014 01:23 PM  Clinical Social Work is seeking post-discharge placement for this patient at the following level of care:   SKILLED NURSING   (*CSW will update this form in Epic as items are completed)     Patient/family provided with [facility] Department of Clinical Social Work's list of facilities offering this level of care within the geographic area requested by the patient (or if unable, by the patient's family).  01/19/2014  Patient/family informed of their freedom to choose among providers that offer the needed level of care, that participate in Medicare, Medicaid or managed care program needed by the patient, have an available bed and are willing to accept the patient.    Patient/family informed of [facility]' ownership interest in [facility], as well as of the fact that they are under no obligation to receive care at this facility.  PASARR submitted to EDS on 01/19/2014 PASARR number received on 01/19/2014  FL2 transmitted to all facilities in geographic area requested by pt/family on  01/19/2014 FL2 transmitted to all facilities within larger geographic area on   Patient informed that his/her managed care company has contracts with or will negotiate with  certain facilities, including the following:     Patient/family informed of bed offers received:   Patient chooses bed at  Physician recommends and patient chooses bed at    Patient to be transferred to  on   Patient to be transferred to facility by  Patient and family notified of transfer on  Name of family member notified:    The following physician request were entered in Epic:   Additional Comments:

## 2014-01-19 NOTE — Progress Notes (Signed)
   Subjective: 1 Day Post-Op Procedure(s) (LRB): LEFT TOTAL HIP ARTHROPLASTY ANTERIOR APPROACH (Left) Patient reports pain as mild.   Patient seen in rounds with Dr. Wynelle Link. Doing well this morning. Patient is well, but has had some minor complaints of pain in the hip, requiring pain medications We will start therapy today.  Plan is to go to inpatient rehab at [facility] or [facility].  Objective: Vital signs in last 24 hours: Temp:  [97.5 F (36.4 C)-98.4 F (36.9 C)] 98.4 F (36.9 C) (10/27 0606) Pulse Rate:  [57-80] 70 (10/27 0606) Resp:  [10-18] 16 (10/27 0606) BP: (103-136)/(53-75) 103/54 mmHg (10/27 0606) SpO2:  [93 %-100 %] 97 % (10/27 0606) Weight:  [70.761 kg (156 lb)] 70.761 kg (156 lb) (10/26 1303)  Intake/Output from previous day:  Intake/Output Summary (Last 24 hours) at 01/19/14 0815 Last data filed at 01/19/14 0071  Gross per 24 hour  Intake 2743.75 ml  Output   1910 ml  Net 833.75 ml    Intake/Output this shift:    Labs:  Recent Labs  01/19/14 0407  HGB 10.8*    Recent Labs  01/19/14 0407  WBC 9.2  RBC 3.98  HCT 32.5*  PLT 161    Recent Labs  01/19/14 0407  NA 133*  K 3.9  CL 100  CO2 22  BUN 12  CREATININE 0.65  GLUCOSE 192*  CALCIUM 8.0*   No results found for this basename: LABPT, INR,  in the last 72 hours  EXAM General - Patient is Alert, Appropriate and Oriented Extremity - Neurovascular intact Sensation intact distally Dressing - dressing C/D/I Motor Function - intact, moving foot and toes well on exam.  Hemovac pulled without difficulty.  Past Medical History  Diagnosis Date  . Venous hypertension     varicose veins  . Hyperlipidemia   . GERD (gastroesophageal reflux disease)   . Osteoporosis   . Prediabetes   . IBS (irritable bowel syndrome)   . Complication of anesthesia   . PONV (postoperative nausea and vomiting)   . Fibromyalgia   . Difficulty sleeping     DUE TO PAIN    Assessment/Plan: 1 Day  Post-Op Procedure(s) (LRB): LEFT TOTAL HIP ARTHROPLASTY ANTERIOR APPROACH (Left) Principal Problem:   OA (osteoarthritis) of hip  Estimated body mass index is 28.53 kg/(m^2) as calculated from the following:   Height as of this encounter: 5\' 2"  (1.575 m).   Weight as of this encounter: 70.761 kg (156 lb). Advance diet Up with therapy Discharge to SNF Fluids this morning due to soft pressure  DVT Prophylaxis - Xarelto Weight Bearing As Tolerated left Leg Hemovac Pulled Begin Therapy  Arlee Muslim, PA-C Orthopaedic Surgery 01/19/2014, 8:15 AM

## 2014-01-19 NOTE — Progress Notes (Signed)
Clinical Social Work Department BRIEF PSYCHOSOCIAL ASSESSMENT 01/19/2014  Patient:  Dominique Hawkins, Dominique Hawkins     Account Number:  [id number]     Admit date:  01/18/2014  Clinical Social Worker:  Lacie Scotts  Date/Time:  01/19/2014 01:15 PM  Referred by:  Physician  Date Referred:  01/19/2014 Referred for  SNF Placement   Other Referral:   Interview type:  Patient Other interview type:    PSYCHOSOCIAL DATA Living Status:  HUSBAND Admitted from facility:   Level of care:   Primary support name:  Rick Primary support relationship to patient:  SPOUSE Degree of support available:   unclear    CURRENT CONCERNS Current Concerns  Post-Acute Placement   Other Concerns:    SOCIAL WORK ASSESSMENT / PLAN Pt is a 73 yr old female living at home prior to hospitalization. CSW met with pt to assist with d/c planning. This is a planned admission. Pt has made prior arrangements to have [facility] Rehab at [facility] following hospital d/c. CSW has contacted SNF and is awaiting confirmation of d/c plan. CSW will continue to follow to assist with d/c planning to SNF.   Assessment/plan status:  Psychosocial Support/Ongoing Assessment of Needs Other assessment/ plan:   Information/referral to community resources:   Insurance coverage for SNF and ambulance transport reviewed.    PATIENT'S/FAMILY'S RESPONSE TO PLAN OF CARE: Pt had a fairly good night following surgery. She is motivated to work with PT and is looking forward to having rehab at [facility] .   Werner Lean LCSW [phone]

## 2014-01-19 NOTE — Care Management Note (Signed)
    Page 1 of 1   01/19/2014     10:50:46 AM CARE MANAGEMENT NOTE 01/19/2014  Patient:  Dominique Hawkins, Dominique Hawkins   Account Number:  [id number]  Date Initiated:  01/19/2014  Documentation initiated by:  [facility]  Subjective/Objective Assessment:   adm: LEFT TOTAL HIP ARTHROPLASTY ANTERIOR APPROACH (Left)     Action/Plan:   SNF   Anticipated DC Date:  01/20/2014   Anticipated DC Plan:  [place]  CM consult      Choice offered to / List presented to:             Status of service:  Completed, signed off Medicare Important Message given?   (If response is "NO", the following Medicare IM given date fields will be blank) Date Medicare IM given:   Medicare IM given by:   Date Additional Medicare IM given:   Additional Medicare IM given by:    Discharge Disposition:  [place]  Per UR Regulation:    If discussed at Long Length of Stay Meetings, dates discussed:    Comments:  01/19/14 10:45 CM notes pt to go to SNF for rehab;[facility] arranging.  No other CM needs were communicated.  Mariane Masters, BSN, CM [phone].

## 2014-01-20 LAB — CBC
HEMATOCRIT: 29.9 % — AB (ref 36.0–46.0)
Hemoglobin: 10.1 g/dL — ABNORMAL LOW (ref 12.0–15.0)
MCH: 27.7 pg (ref 26.0–34.0)
MCHC: 33.8 g/dL (ref 30.0–36.0)
MCV: 81.9 fL (ref 78.0–100.0)
PLATELETS: 175 10*3/uL (ref 150–400)
RBC: 3.65 MIL/uL — AB (ref 3.87–5.11)
RDW: 14.4 % (ref 11.5–15.5)
WBC: 9.8 10*3/uL (ref 4.0–10.5)

## 2014-01-20 LAB — BASIC METABOLIC PANEL
ANION GAP: 12 (ref 5–15)
BUN: 13 mg/dL (ref 6–23)
CHLORIDE: 106 meq/L (ref 96–112)
CO2: 21 meq/L (ref 19–32)
Calcium: 8.3 mg/dL — ABNORMAL LOW (ref 8.4–10.5)
Creatinine, Ser: 0.59 mg/dL (ref 0.50–1.10)
GFR calc Af Amer: >90 mL/min (ref >90)
GFR calc non Af Amer: 89 mL/min — ABNORMAL LOW (ref >90)
Glucose, Bld: 164 mg/dL — ABNORMAL HIGH (ref 70–99)
Potassium: 4.1 mEq/L (ref 3.7–5.3)
Sodium: 139 mEq/L (ref 137–147)

## 2014-01-20 NOTE — Progress Notes (Signed)
   Subjective: 2 Days Post-Op Procedure(s) (LRB): LEFT TOTAL HIP ARTHROPLASTY ANTERIOR APPROACH (Left) Patient reports pain as mild.   Patient seen in rounds with Dr. Wynelle Link. Patient is well, and has had no acute complaints or problems Patient is ready to go to [facility] today  Objective: Vital signs in last 24 hours: Temp:  [97.7 F (36.5 C)-99 F (37.2 C)] 98.2 F (36.8 C) (10/28 0506) Pulse Rate:  [71-77] 71 (10/28 0506) Resp:  [16-20] 16 (10/28 0506) BP: (105-118)/(52-72) 116/72 mmHg (10/28 0506) SpO2:  [95 %-98 %] 96 % (10/28 0506)  Intake/Output from previous day:  Intake/Output Summary (Last 24 hours) at 01/20/14 0831 Last data filed at 01/20/14 0600  Gross per 24 hour  Intake 1153.5 ml  Output   1600 ml  Net -446.5 ml     Labs:  Recent Labs  01/19/14 0407 01/20/14 0435  HGB 10.8* 10.1*    Recent Labs  01/19/14 0407 01/20/14 0435  WBC 9.2 9.8  RBC 3.98 3.65*  HCT 32.5* 29.9*  PLT 161 175    Recent Labs  01/19/14 0407 01/20/14 0435  NA 133* 139  K 3.9 4.1  CL 100 106  CO2 22 21  BUN 12 13  CREATININE 0.65 0.59  GLUCOSE 192* 164*  CALCIUM 8.0* 8.3*   No results found for this basename: LABPT, INR,  in the last 72 hours  EXAM: General - Patient is Alert, Appropriate and Oriented Extremity - Neurovascular intact Sensation intact distally Dorsiflexion/Plantar flexion intact Incision - clean, dry, no drainage, healing Motor Function - intact, moving foot and toes well on exam.   Assessment/Plan: 2 Days Post-Op Procedure(s) (LRB): LEFT TOTAL HIP ARTHROPLASTY ANTERIOR APPROACH (Left) Procedure(s) (LRB): LEFT TOTAL HIP ARTHROPLASTY ANTERIOR APPROACH (Left) Past Medical History  Diagnosis Date  . Venous hypertension     varicose veins  . Hyperlipidemia   . GERD (gastroesophageal reflux disease)   . Osteoporosis   . Prediabetes   . IBS (irritable bowel syndrome)   . Complication of anesthesia   . PONV (postoperative nausea and  vomiting)   . Fibromyalgia   . Difficulty sleeping     DUE TO PAIN   Principal Problem:   OA (osteoarthritis) of hip  Estimated body mass index is 28.53 kg/(m^2) as calculated from the following:   Height as of this encounter: 5\' 2"  (1.575 m).   Weight as of this encounter: 70.761 kg (156 lb). Up with therapy Discharge to SNF Diet - Regular diet Follow up - in 2 weeks Activity - WBAT Disposition - Skilled nursing facility Condition Upon Discharge - Good D/C Meds - See DC Summary DVT Prophylaxis - Xarelto  Arlee Muslim, PA-C Orthopaedic Surgery 01/20/2014, 8:31 AM

## 2014-01-20 NOTE — Progress Notes (Signed)
Clinical Social Work Department CLINICAL SOCIAL WORK PLACEMENT NOTE 01/20/2014  Patient:  Dominique Hawkins, Dominique Hawkins  Account Number:  [id number] Admit date:  01/18/2014  Clinical Social Worker:  Werner Lean, LCSW  Date/time:  01/19/2014 01:23 PM  Clinical Social Work is seeking post-discharge placement for this patient at the following level of care:   SKILLED NURSING   (*CSW will update this form in Epic as items are completed)     Patient/family provided with [place] Department of Clinical Social Work's list of facilities offering this level of care within the geographic area requested by the patient (or if unable, by the patient's family).  01/19/2014  Patient/family informed of their freedom to choose among providers that offer the needed level of care, that participate in Medicare, Medicaid or managed care program needed by the patient, have an available bed and are willing to accept the patient.    Patient/family informed of [facility]' ownership interest in [facility], as well as of the fact that they are under no obligation to receive care at this facility.  PASARR submitted to EDS on 01/19/2014 PASARR number received on 01/19/2014  FL2 transmitted to all facilities in geographic area requested by pt/family on  01/19/2014 FL2 transmitted to all facilities within larger geographic area on   Patient informed that his/her managed care company has contracts with or will negotiate with  certain facilities, including the following:     Patient/family informed of bed offers received:  01/19/2014 Patient chooses bed at [facility] Physician recommends and patient chooses bed at    Patient to be transferred to [facility] on  01/20/2014 Patient to be transferred to facility by [facility] Patient and family notified of transfer on 01/20/2014 Name of family member notified:  SPOUSE  The following physician request were entered in Epic:   Additional Comments: Pt  / spouse are in agreement with d/c to SNF today. Ptis able to transport by car. NSG reviewed d/c summary, scripts, avs. Scripts included in d/c packet.  Werner Lean LCSW [phone]

## 2014-01-20 NOTE — Progress Notes (Signed)
Physical Therapy Treatment Patient Details Name: Dominique Hawkins MRN: [id number] DOB: Aug 21, 1940 Today's Date: 01/20/2014    History of Present Illness 73 yo female s/p L THA-direct anterior 01/18/14.     PT Comments    Progressing with mobility. Pt continues to report moderate pain level with mobility-7/10. Plan is for d/c to SNF on today. Pt is okay to transport by car.   Follow Up Recommendations  SNF     Equipment Recommendations  None recommended by PT    Recommendations for Other Services OT consult     Precautions / Restrictions Precautions Precautions: Fall Restrictions Weight Bearing Restrictions: No LLE Weight Bearing: Weight bearing as tolerated    Mobility  Bed Mobility Overal bed mobility: Needs Assistance Bed Mobility: Supine to Sit     Supine to sit: Min assist     General bed mobility comments: Assist for L LE  Transfers Overall transfer level: Needs assistance Equipment used: Rolling walker (2 wheeled) Transfers: Sit to/from Stand Sit to Stand: Min guard         General transfer comment:  VCS safety, technique, hand placment. close guard for safety  Ambulation/Gait Ambulation/Gait assistance: Min guard Ambulation Distance (Feet): 150 Feet Assistive device: Rolling walker (2 wheeled) Gait Pattern/deviations: Step-to pattern;Step-through pattern     General Gait Details: tolerated distance fairly well. VCs sfaety, technique, sequence. Pt transitioned to reciprocal gait pattern as distance progressed. However noted gait to be more antalgic with reciprocal gait pattern. Encouraged pt to maintain step to pattern if it is less painful.    Stairs            Wheelchair Mobility    Modified Rankin (Stroke Patients Only)       Balance                                    Cognition Arousal/Alertness: Awake/alert Behavior During Therapy: WFL for tasks assessed/performed Overall Cognitive Status: Within Functional Limits  for tasks assessed                      Exercises Total Joint Exercises Ankle Circles/Pumps: AROM;Both;15 reps;Seated Quad Sets: AROM;Both;15 reps;Seated Heel Slides: AAROM;Left;Seated;15 reps Hip ABduction/ADduction: AAROM;Left;Seated;15 reps Long Arc Quad: AROM;Left;10 reps;Seated    General Comments        Pertinent Vitals/Pain Pain Assessment: 0-10 Pain Score: 7  Pain Location: L thigh, hip area Pain Descriptors / Indicators: Burning;Sore Pain Intervention(s): Monitored during session;Ice applied    Home Living                      Prior Function            PT Goals (current goals can now be found in the care plan section) Progress towards PT goals: Progressing toward goals    Frequency  7X/week    PT Plan Current plan remains appropriate    Co-evaluation             End of Session Equipment Utilized During Treatment: Gait belt Activity Tolerance: Patient tolerated treatment well Patient left: in chair;with call bell/phone within reach     Time: 0953-1011 PT Time Calculation (min): 18 min  Charges:  $Gait Training: 8-22 mins                    G Codes:      Weston Anna, MPT Pager: [phone]

## 2014-06-01 ENCOUNTER — Ambulatory Visit (HOSPITAL_COMMUNITY): Admission: RE | Admit: 2014-06-01 | Payer: Medicare Other | Source: Ambulatory Visit | Admitting: Ophthalmology

## 2014-06-01 ENCOUNTER — Encounter (HOSPITAL_COMMUNITY): Admission: RE | Payer: Self-pay | Source: Ambulatory Visit

## 2014-06-01 SURGERY — TREATMENT, USING YAG LASER
Anesthesia: LOCAL | Laterality: Left

## 2015-11-24 DIAGNOSIS — K219 Gastro-esophageal reflux disease without esophagitis: Secondary | ICD-10-CM | POA: Insufficient documentation

## 2015-11-24 DIAGNOSIS — E559 Vitamin D deficiency, unspecified: Secondary | ICD-10-CM | POA: Insufficient documentation

## 2015-11-24 DIAGNOSIS — M810 Age-related osteoporosis without current pathological fracture: Secondary | ICD-10-CM | POA: Insufficient documentation

## 2015-11-24 DIAGNOSIS — E782 Mixed hyperlipidemia: Secondary | ICD-10-CM | POA: Insufficient documentation

## 2015-12-15 IMAGING — CR DG HIP (WITH OR WITHOUT PELVIS) 2-3V*L*
3 series · 3 of 3 positions shown · non-contrast
Comparison: None.

CLINICAL DATA: Preop for left hip surgery scheduled for 01/18/2014,
left hip arthritis

EXAM:
LEFT HIP - COMPLETE 2+ VIEW

[t pelvis a.p.]
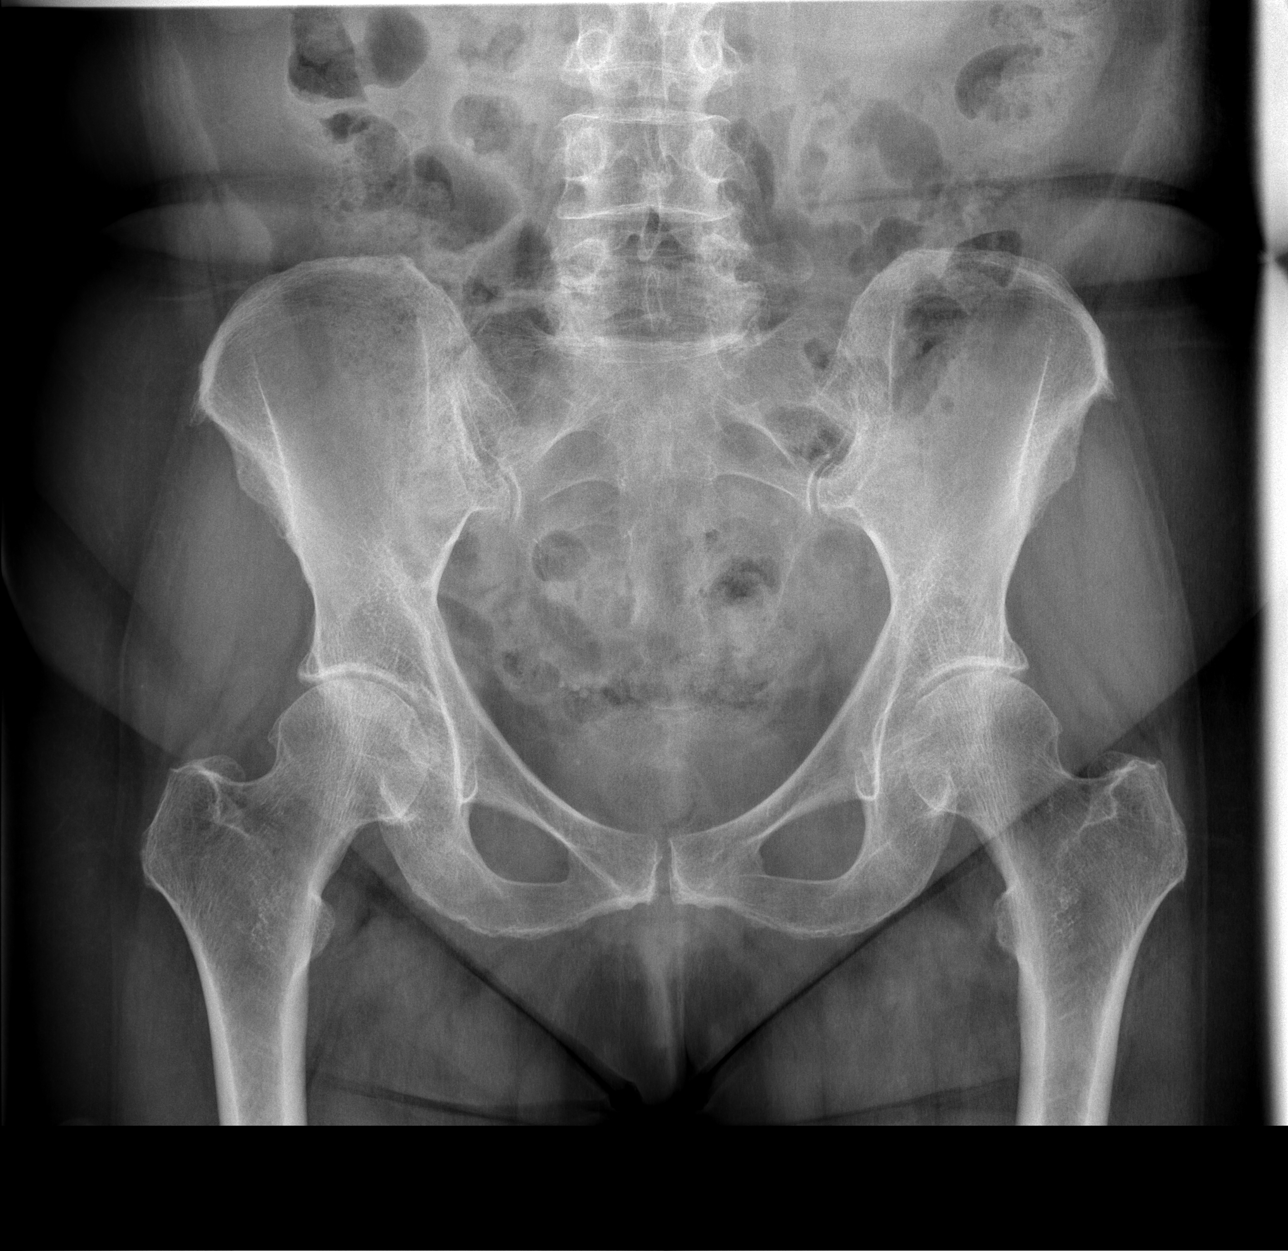

[t hip ap left]
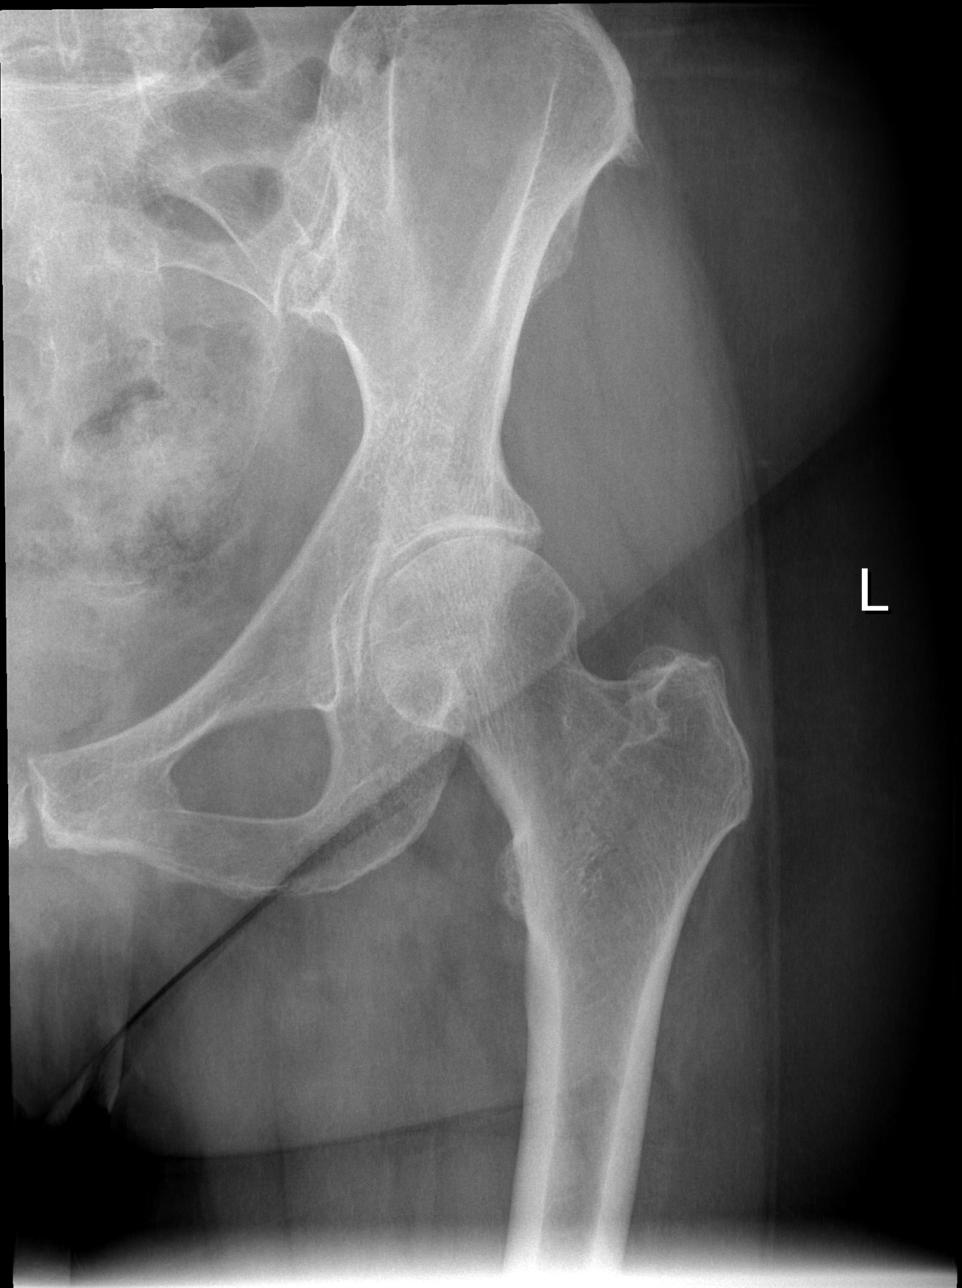

[t hip frog leg left]
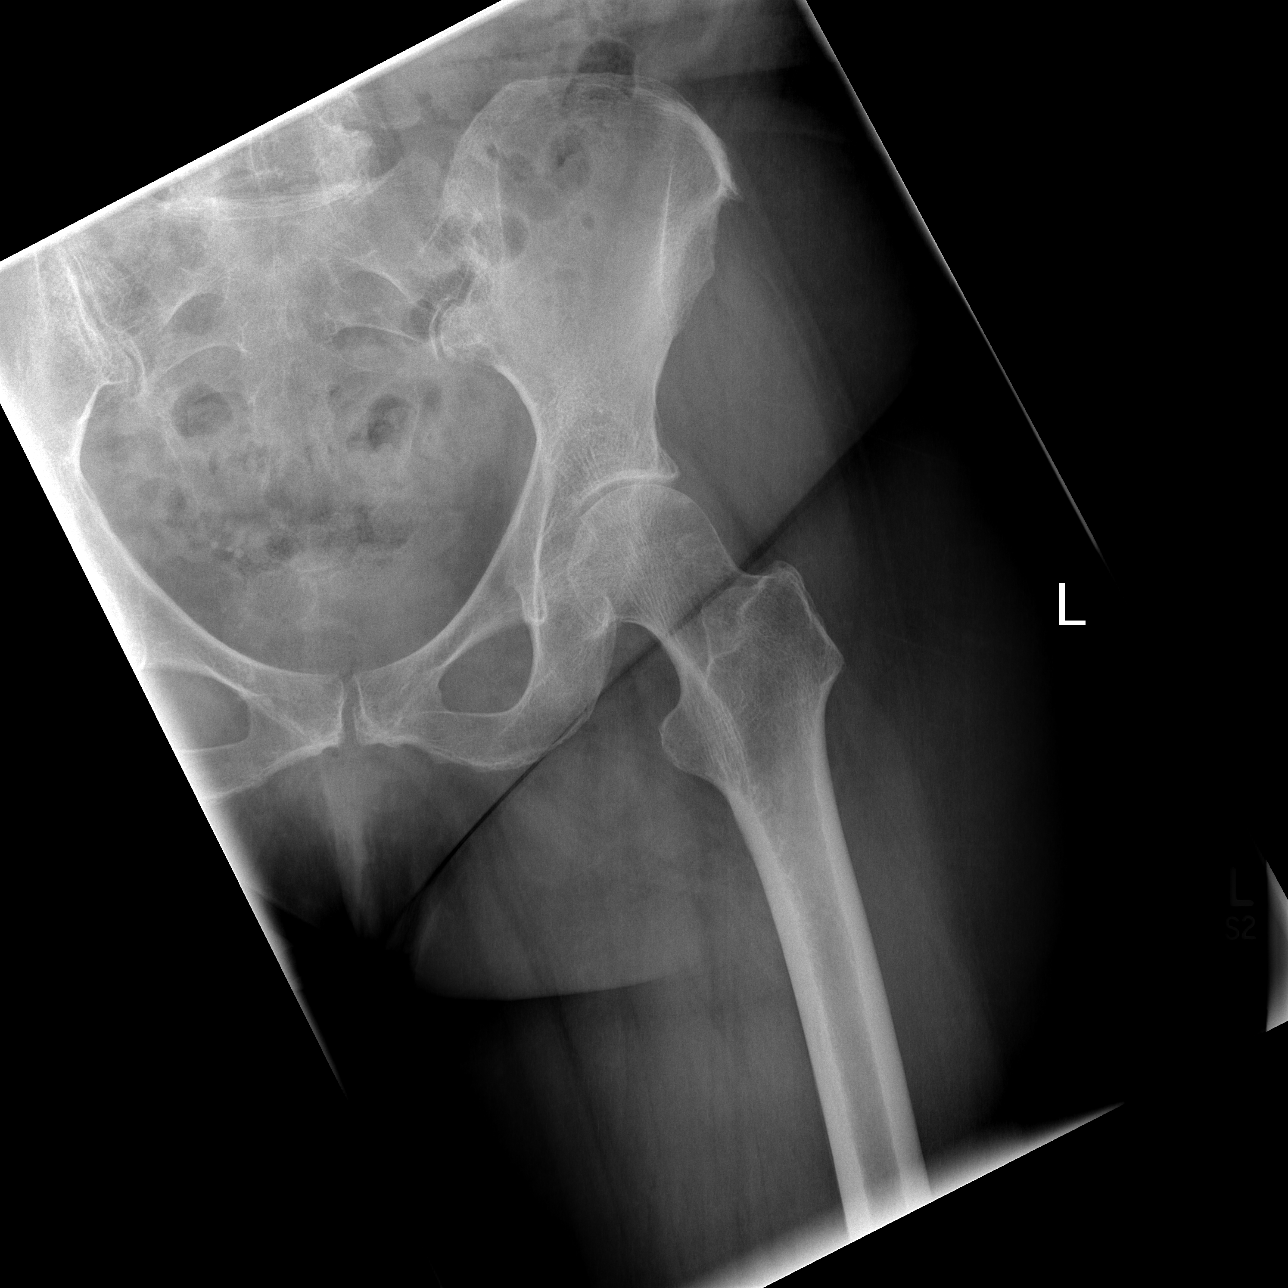

[3 of 3 positions shown; findings below may reference images not displayed]

FINDINGS: Pelvic bones are intact. Mild to moderate bilateral hip joint space
narrowing and mild bilateral osteophyte formation. No fracture
dislocation or other significant focal osseous abnormality.
IMPRESSION: Bilateral hip arthritis

## 2015-12-19 IMAGING — DX DG PORTABLE PELVIS
1 series · 1 of 1 positions shown · non-contrast
Comparison: 01/14/2014

CLINICAL DATA: Postop left hip replacement.  Subsequent encounter

EXAM:
PORTABLE LEFT HIP - 1 VIEW; PORTABLE PELVIS 1-2 VIEWS

[pelvis ap]
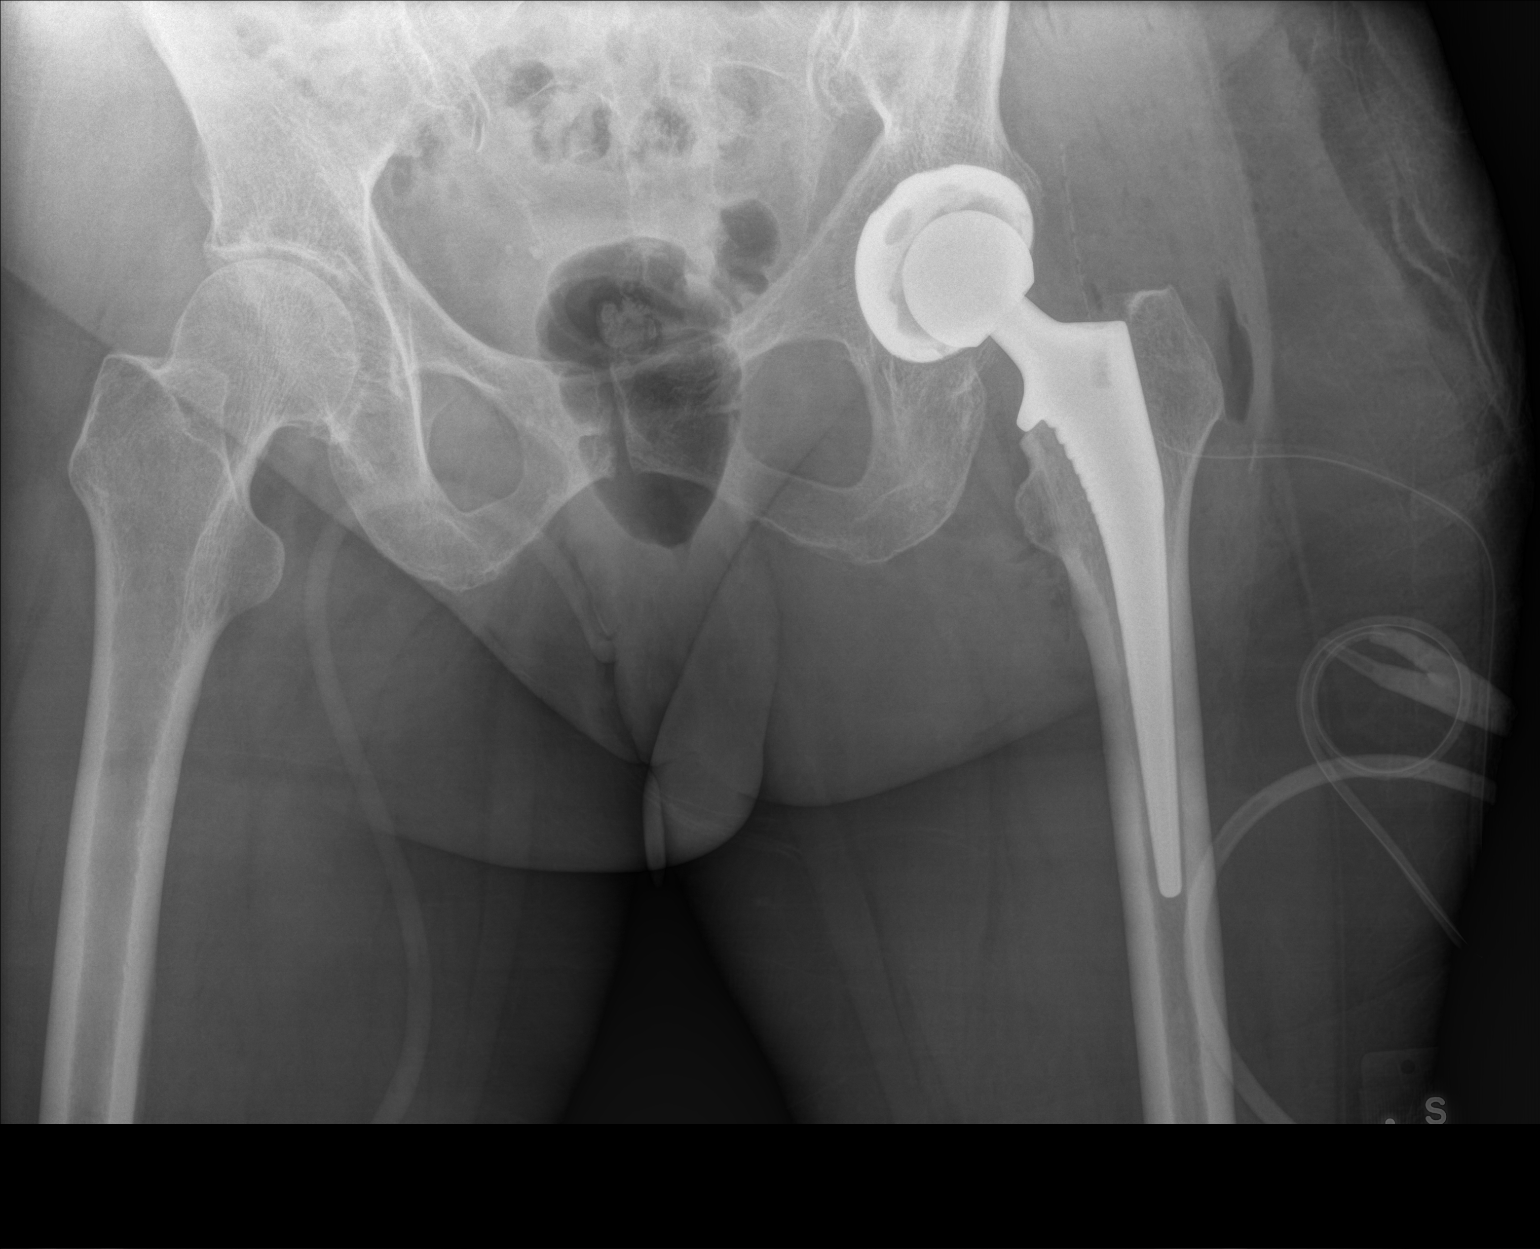

[1 of 1 positions shown; findings below may reference images not displayed]

FINDINGS: New total left hip arthroplasty. No periprosthetic fracture or
dislocation. Surgical drain subcutaneous gas noted.
IMPRESSION: Total left hip arthroplasty.  No acute findings.

## 2015-12-19 IMAGING — DX DG HIP 1V PORT*L*
1 series · 2 of 2 positions shown · non-contrast
Comparison: 01/14/2014

CLINICAL DATA: Postop left hip replacement.  Subsequent encounter

EXAM:
PORTABLE LEFT HIP - 1 VIEW; PORTABLE PELVIS 1-2 VIEWS

[Series 1: hip x-table · 0.14mm/px · 2 of 2 slices shown]
[im 1/2]
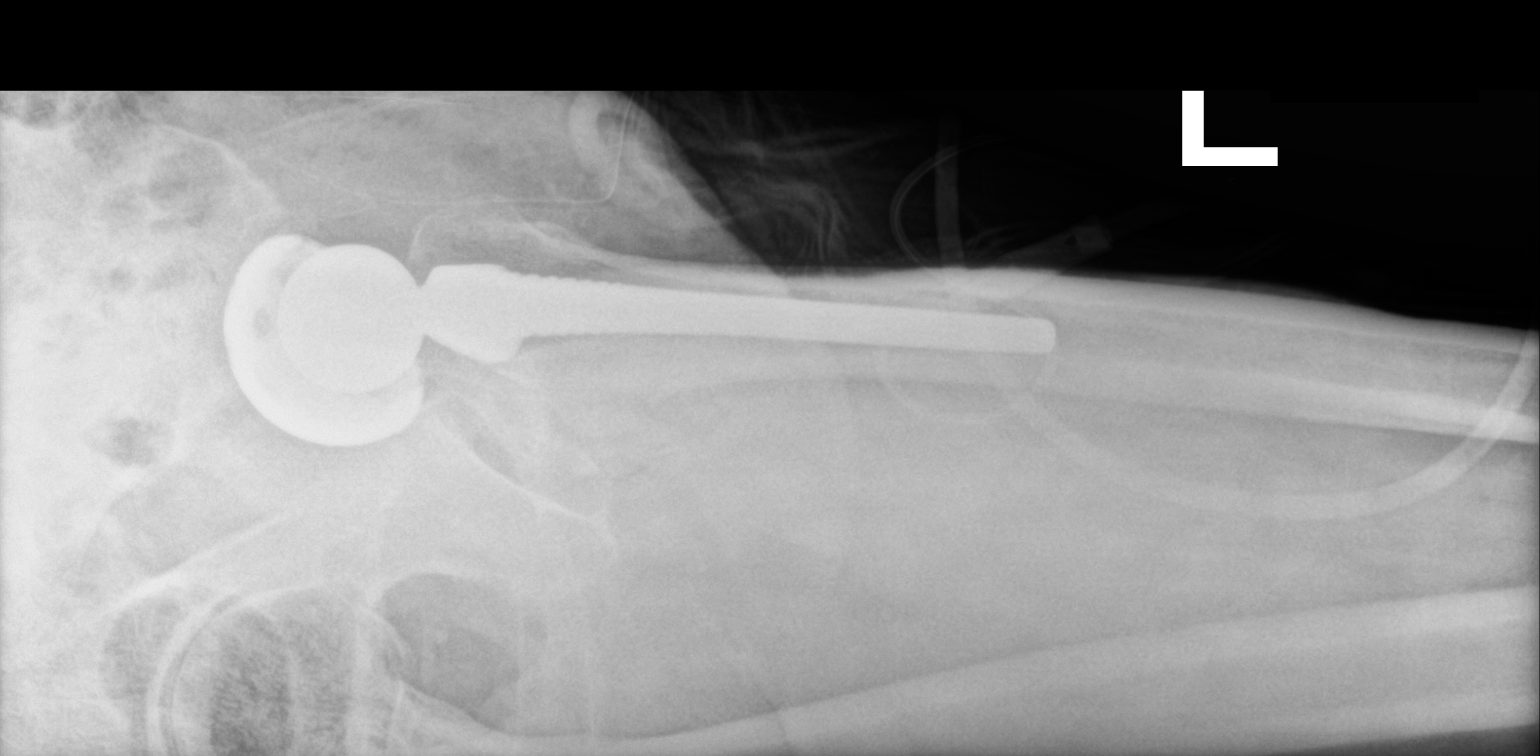
[im 2/2]
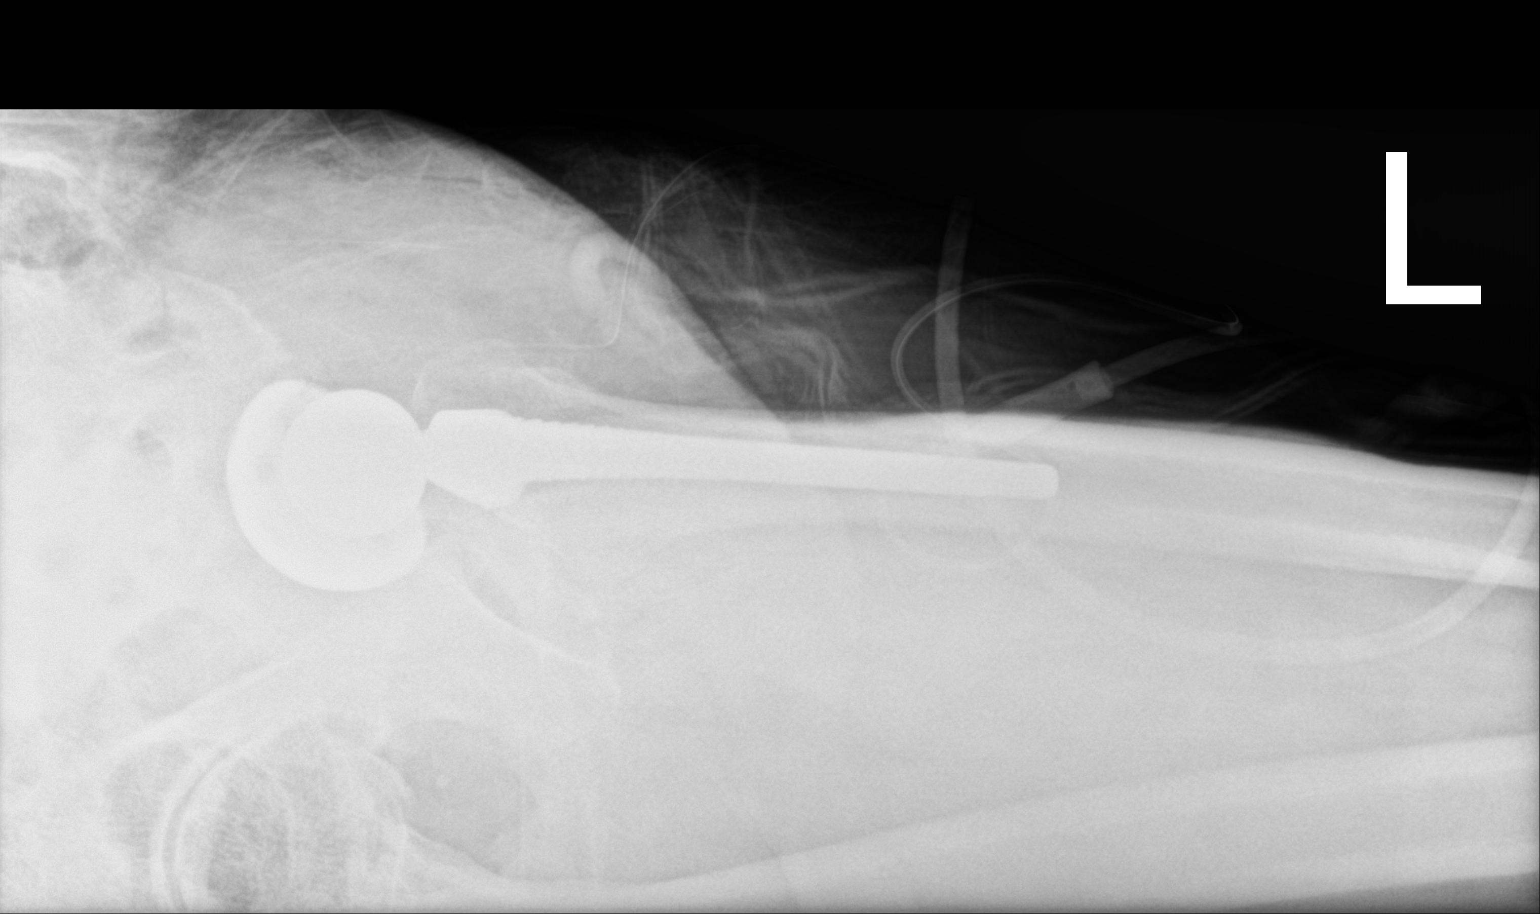

[2 of 2 positions shown; findings below may reference images not displayed]

FINDINGS: New total left hip arthroplasty. No periprosthetic fracture or
dislocation. Surgical drain subcutaneous gas noted.
IMPRESSION: Total left hip arthroplasty.  No acute findings.

## 2017-07-04 DIAGNOSIS — R1013 Epigastric pain: Secondary | ICD-10-CM | POA: Insufficient documentation

## 2017-07-22 DIAGNOSIS — M5136 Other intervertebral disc degeneration, lumbar region: Secondary | ICD-10-CM | POA: Insufficient documentation

## 2017-07-22 DIAGNOSIS — M47816 Spondylosis without myelopathy or radiculopathy, lumbar region: Secondary | ICD-10-CM | POA: Insufficient documentation

## 2018-12-25 ENCOUNTER — Emergency Department (HOSPITAL_BASED_OUTPATIENT_CLINIC_OR_DEPARTMENT_OTHER): Payer: Medicare Other

## 2018-12-25 ENCOUNTER — Emergency Department (HOSPITAL_BASED_OUTPATIENT_CLINIC_OR_DEPARTMENT_OTHER)
Admission: EM | Admit: 2018-12-25 | Discharge: 2018-12-25 | Disposition: A | Discharge status: Home / Self Care | Payer: Medicare Other | Attending: Emergency Medicine | Admitting: Emergency Medicine

## 2018-12-25 ENCOUNTER — Other Ambulatory Visit: Payer: Self-pay

## 2018-12-25 ENCOUNTER — Encounter (HOSPITAL_BASED_OUTPATIENT_CLINIC_OR_DEPARTMENT_OTHER): Payer: Self-pay | Admitting: *Deleted

## 2018-12-25 DIAGNOSIS — Z79899 Other long term (current) drug therapy: Secondary | ICD-10-CM | POA: Diagnosis not present

## 2018-12-25 DIAGNOSIS — R7303 Prediabetes: Secondary | ICD-10-CM | POA: Insufficient documentation

## 2018-12-25 DIAGNOSIS — M79672 Pain in left foot: Secondary | ICD-10-CM | POA: Diagnosis present

## 2018-12-25 MED ORDER — OXYCODONE HCL 5 MG PO TABS: 5.0000 mg | ORAL_TABLET | Freq: Three times a day (TID) | ORAL | 0 refills | Status: DC | PRN

## 2018-12-25 MED ORDER — PREDNISONE 50 MG PO TABS
50.0000 mg | ORAL_TABLET | Freq: Every day | ORAL | 0 refills | Status: AC
Start: 2018-12-25 — End: 2018-12-30

## 2018-12-25 NOTE — ED Provider Notes (Signed)
Dominique Hawkins Note   CSN: Dominique Hawkins:[id number] Arrival date & time: 12/25/18  [id number]     History   Chief Complaint Chief Complaint  Patient presents with  . Foot Pain    HPI Dominique Hawkins is a 78 y.o. female with a past medical history significant for IBS-D, fibromyalgia, GERD, and hyperlipidemia who presents to the ED due to 10/10, constant left foot pain x 3 weeks. Patient admits to sudden onset of pain which started on her 2nd toe then worked its way up the top of her foot. Patient denies trauma. Patient does not have a history of diabetes or gout. Patient admits that the pain is worse with ambulation. She has tried Aleve with moderate relief of pain. Patient denies fever, chills, numbness/tingling, chest pain, and shortness of breath.  Past Medical History:  Diagnosis Date  . Complication of anesthesia   . Difficulty sleeping    DUE TO PAIN  . Fibromyalgia   . GERD (gastroesophageal reflux disease)   . Hyperlipidemia   . IBS (irritable bowel syndrome)   . Osteoporosis   . PONV (postoperative nausea and vomiting)   . Prediabetes   . Venous hypertension    varicose veins    Patient Active Problem List   Diagnosis Date Noted  . OA (osteoarthritis) of hip 01/18/2014  . Varicose veins of lower extremities with other complications [id number]    Past Surgical History:  Procedure Laterality Date  . ABDOMINAL HYSTERECTOMY    . BLADDER SUSPENSION  2008  . BREAST ENHANCEMENT SURGERY    . BTL    . CHOLECYSTECTOMY    . ENDOVENOUS ABLATION SAPHENOUS VEIN W/ LASER  02-08-2011  right greater saphenous vein    and sclerotherapy  right and left legs  . TONSILLECTOMY    . TOTAL HIP ARTHROPLASTY Left 01/18/2014   Procedure: LEFT TOTAL HIP ARTHROPLASTY ANTERIOR APPROACH;  Surgeon: Gearlean Alf, MD;  Location: [facility];  Service: Orthopedics;  Laterality: Left;     OB History    Gravida  2   Para      Term      Preterm      AB      Living  2      SAB      TAB      Ectopic      Multiple      Live Births               Home Medications    Prior to Admission medications   Medication Sig Start Date End Date Taking? Authorizing Hawkins  loperamide (IMODIUM) 2 MG capsule Take by mouth daily.   Yes Hawkins, Historical, MD  Cholecalciferol (VITAMIN D-3 PO) Take 1 tablet by mouth daily.    Hawkins, Historical, MD  oxyCODONE (ROXICODONE) 5 MG immediate release tablet Take 1 tablet (5 mg total) by mouth every 8 (eight) hours as needed for severe pain. 12/25/18   Cheek, [place]er Locket, PA-C  predniSONE (DELTASONE) 50 MG tablet Take 1 tablet (50 mg total) by mouth daily with breakfast for 5 days. 12/25/18 12/30/18  Jonette Eva, PA-C    Family History Family History  Problem Relation Age of Onset  . Mental illness Mother   . Cancer Mother   . Diabetes Father   . Cancer Father   . Diabetes Brother     Social History Social History   Tobacco Use  . Smoking status: Never Smoker  . Smokeless tobacco:  Never Used  Substance Use Topics  . Alcohol use: Yes    Comment: OCCASIONAL  . Drug use: No     Allergies   Tramadol, Codeine, and Tylenol [acetaminophen]   Review of Systems Review of Systems  Constitutional: Negative for chills and fever.  Respiratory: Negative for shortness of breath.   Cardiovascular: Negative for chest pain and palpitations.  Musculoskeletal: Positive for arthralgias, gait problem (difficulty walking due to left foot pain) and myalgias.  Skin: Negative for color change, rash and wound.  Neurological: Negative for weakness and numbness.  All other systems reviewed and are negative.    Physical Exam Updated Vital Signs BP 134/63 (BP Location: Right Arm)   Pulse 77   Temp 98 F (36.7 C) (Oral)   Resp 14   Ht 5\' 2"  (1.575 m)   Wt 64.9 kg   SpO2 98%   BMI 26.16 kg/m   Physical Exam Constitutional:      General: She is not in acute distress.    Appearance: She is not  ill-appearing.  HENT:     Head: Normocephalic.  Neck:     Musculoskeletal: Neck supple.  Cardiovascular:     Rate and Rhythm: Normal rate and regular rhythm.     Pulses: Normal pulses.     Heart sounds: Normal heart sounds. No murmur. No friction rub. No gallop.   Pulmonary:     Effort: Pulmonary effort is normal.     Breath sounds: Normal breath sounds.  Abdominal:     General: Abdomen is flat.     Palpations: Abdomen is soft.  Musculoskeletal: Normal range of motion.     Comments: Dorsal aspect of left foot is tender to palpation. Increased pain with movement of 2nd toe. No tenderness to palpation on bottom of foot. Peripheral pulses intact bilaterally. No notable erythema or edema of left foot. No abrasions or signs of trauma. Normal right foot. Normal range of motion of ankle bilaterally.  Skin:    General: Skin is warm and dry.     Capillary Refill: Capillary refill takes less than 2 seconds.  Neurological:     General: No focal deficit present.     Mental Status: She is alert.     ED Treatments / Results  Labs (all labs ordered are listed, but only abnormal results are displayed) Labs Reviewed - No data to display  EKG None  Radiology Dg Foot Complete Left  Result Date: 12/25/2018 CLINICAL DATA:  Anterior foot pain, no known injury. EXAM: LEFT FOOT - COMPLETE 3+ VIEW COMPARISON:  None. FINDINGS: There is no evidence of fracture or dislocation. There is no evidence of arthropathy or other focal bone abnormality. Soft tissues are unremarkable. IMPRESSION: Negative. Electronically Signed   By: Dominique Hawkins M.D.   On: 12/25/2018 10:32    Procedures Procedures (including critical care time)  Medications Ordered in ED Medications - No data to display   Initial Impression / Assessment and Plan / ED Course  I have reviewed the triage vital signs and the nursing notes.  Pertinent labs & imaging results that were available during my care of the patient were reviewed by  me and considered in my medical decision making (see chart for details).  Dominique Hawkins is a 78 year old female who presents to the ED due to severe left foot pain. On physical exam, patient is non-ill appearing and afebrile. Vitals are all within normal limits. Patient's left foot has no significant erythema or edema,  but has significant tenderness to palpation over the dorsal aspect of the left foot. Normal ROM of ankle. Will obtain left foot x-ray to check for bony fractures. No signs of cellulitis.   Left foot x-ray showed no signs of bony fractures. Patient has been sent home with steroids and pain medication. Dr. Laverta Baltimore checked the [place] drug database prior to prescribing oxycodone. Patient will be discharged home. She has been instructed to follow-up with her PCP if pain does not improve after the steroid treatment. Instructions on icing foot were given to patient. Strict return precautions discussed with patient. Patient agrees to plan and states understanding.  Final Clinical Impressions(s) / ED Diagnoses   Final diagnoses:  Foot pain, left    ED Discharge Orders         Ordered    predniSONE (DELTASONE) 50 MG tablet  Daily with breakfast     12/25/18 1057    oxyCODONE (ROXICODONE) 5 MG immediate release tablet  Every 8 hours PRN     12/25/18 1057           Jonette Eva, PA-C 12/25/18 1434    Margette Fast, MD 12/25/18 2021

## 2018-12-25 NOTE — ED Triage Notes (Signed)
Foot pain started 3 weeks ago and it's getting worst.

## 2018-12-25 NOTE — Discharge Instructions (Addendum)
You are being sent home with a steroid and pain medication. Take the steroid every morning with breakfast for the next 5 days. It will help with the inflammation. I am also sending you home with oxycodone. Take as needed for pain every 8 hours. Please follow-up with your PCP if your pain does not improve after the steroid treatment. Return to the ED if you develop a fever or severe pain.

## 2018-12-25 NOTE — ED Notes (Signed)
ED Provider at bedside. 

## 2019-02-04 DIAGNOSIS — R7301 Impaired fasting glucose: Secondary | ICD-10-CM | POA: Insufficient documentation

## 2019-03-25 DIAGNOSIS — H903 Sensorineural hearing loss, bilateral: Secondary | ICD-10-CM | POA: Insufficient documentation

## 2019-04-17 ENCOUNTER — Telehealth: Payer: Self-pay | Admitting: Gastroenterology

## 2019-04-17 NOTE — Telephone Encounter (Signed)
Dr. Rush Landmark, this pt has GI hx with [facility] (records in [place]).  Pt requested to transfer care to you due to high recommendations from friends.  She is not satisfied with current GI as she feels that "the MD makes me do a lot of tests but does not explain to me about my condition."    She would like to be evaluated for IBS--diarrhea and vomiting, hx of hiatal hernia. Please review records and advise scheduling.

## 2019-04-20 NOTE — Telephone Encounter (Signed)
When I return to the clinic, I will review the patient's history and decide whether to move forward with a tertiary opinion with me. I will be back in the office tomorrow. GM

## 2019-04-24 ENCOUNTER — Encounter: Payer: Self-pay | Admitting: Gastroenterology

## 2019-04-24 NOTE — Telephone Encounter (Signed)
I have had an opportunity to briefly review the records.  She has been seen at a quaternary care center for her esophageal dysmotility and issues around her previous hernia repair.  I am happy to further discuss and evaluate the patient as a tertiary opinion, however, having her care still at [facility] where they have advanced specialists is not unreasonable to maintain at this time. I am willing to see this patient in a double slot due to the complexity of her issues. Thank you. GM

## 2019-05-26 ENCOUNTER — Other Ambulatory Visit: Payer: Medicare Other

## 2019-05-26 ENCOUNTER — Ambulatory Visit: Payer: Medicare Other | Admitting: Gastroenterology

## 2019-05-26 ENCOUNTER — Encounter: Payer: Self-pay | Admitting: Gastroenterology

## 2019-05-26 VITALS — BP 140/80 | HR 84 | Temp 98.1°F | Ht 62.0 in | Wt 151.0 lb

## 2019-05-26 DIAGNOSIS — Z9889 Other specified postprocedural states: Secondary | ICD-10-CM

## 2019-05-26 DIAGNOSIS — K529 Noninfective gastroenteritis and colitis, unspecified: Secondary | ICD-10-CM

## 2019-05-26 DIAGNOSIS — K580 Irritable bowel syndrome with diarrhea: Secondary | ICD-10-CM

## 2019-05-26 DIAGNOSIS — K9089 Other intestinal malabsorption: Secondary | ICD-10-CM | POA: Diagnosis not present

## 2019-05-26 DIAGNOSIS — Z9049 Acquired absence of other specified parts of digestive tract: Secondary | ICD-10-CM

## 2019-05-26 DIAGNOSIS — R140 Abdominal distension (gaseous): Secondary | ICD-10-CM

## 2019-05-26 DIAGNOSIS — Z8719 Personal history of other diseases of the digestive system: Secondary | ICD-10-CM

## 2019-05-26 NOTE — Progress Notes (Signed)
[place] VISIT   Primary Care Provider [place] (Inactive) No address on file [phone]  Referring Provider No referring provider defined for this encounter.   Patient Profile: Dominique Hawkins is a 79 y.o. female with a pmh significant for fibromyalgia, GERD (status post fundoplication), hiatal hernia (status post hernia repair), prediabetes, IBS-D, status post cholecystectomy.  The patient presents to the [facility] for an evaluation and management of problem(s) noted below:  Problem List 1. Chronic diarrhea   2. Irritable bowel syndrome with diarrhea   3. Bile salt-induced diarrhea   4. Bloating symptom   5. History of cholecystectomy   6. History of repair of hiatal hernia   7. History of fundoplication     History of Present Illness This is the patient's first visit to the outpatient [place] clinic.  The patient was previously evaluated by digestive health specialists, Dr. Alice Reichert for what she describes as chronic diarrhea/IBS-D.  The patient was then evaluated by [facility] in the esophageal clinic evaluation of issues with heartburn/pyrosis dysphagia.  She eventually underwent evaluation leading to surgical intervention for hiatal hernia with fundoplication and hernia repair in 2019.  The patient continued to have abdominal pain and chronic diarrhea and continued to see GI but felt that a transition of care was needed over the course of the last few months.  She is also seen a GI physician at [facility] but did not find their care or work-up to be ideal.  Today she transitions her care in an effort of trying to see if there is anything else that can be done she describes for greater than 14 years having diarrhea on a daily basis.  She does believe that symptoms occurred years after her gallbladder was removed.  She describes her stools as Bristol scale 5 or 6 mostly.  She has used WelChol in the  past up to 3 pills 3 times daily but has, over years, been able to try to manage her symptoms.  She is currently on 2 WelChol in the morning and she takes 2 mg of loperamide.  If she does this she often will have between 2 and 6 bowel movements per day.  There was a period in time over the course of the last year that she tried to be off of WelChol because of a GI providers recommendation and she had significant abdominal pain when she was off of this.  As result for the last 6 months she has been back on it.  She denies any blood in her stools or mucus.  She has a history of hemorrhoids but they have not been causing her issues.  She never has nocturnal symptoms.  There is no personal history of polyps in any of her colonoscopies per her report.  Her last colonoscopy was with Dr. Alice Reichert within the last 4 to 5 years but she cannot recall if she has never had any biopsies performed to rule out microscopic/collagenous colitis.  She recalls stool studies but this was many years ago.  There is no family history of GI disorders.  Currently, the patient does not have any dysphagia or upper GI symptoms.  GI Review of Systems Positive as above including bloating which occurs on a near daily basis (she has never used Gas-X but has had ineffective with Beano use) Negative for pyrosis, odynophagia, nausea, vomiting, decreased appetite, melena  Review of Systems General: Denies fevers/chills/weight loss HEENT: Denies oral lesions/sore throat  Cardiovascular: Denies chest pain/palpitations Pulmonary: Denies shortness of breath/cough Gastroenterological: See HPI Genitourinary: Denies darkened urine Hematological: Denies easy bruising/bleeding Endocrine: Denies temperature intolerance Dermatological: Denies jaundice Psychological: Mood is stable   Medications Current Outpatient Medications  Medication Sig Dispense Refill  . alendronate (FOSAMAX) 70 MG tablet Take 1 tablet by mouth once a week.    .  Cholecalciferol (VITAMIN D-3 PO) Take 1 tablet by mouth daily.    . colesevelam (WELCHOL) 625 MG tablet Take 1 tablet by mouth 3 (three) times daily.    Marland Kitchen loperamide (IMODIUM) 2 MG capsule Take by mouth daily.    . Multiple Vitamins-Minerals (MULTIVITAMIN GUMMIES WOMENS PO) Take 1 tablet by mouth daily.    . rosuvastatin (CRESTOR) 5 MG tablet Take 5 mg by mouth daily.     No current facility-administered medications for this visit.    Allergies Allergies  Allergen Reactions  . Codeine Nausea And Vomiting, Itching, Nausea Only and Other (See Comments)    "Can't get up" "Can't get up" "Can't get up"   . Tramadol Other (See Comments)    "Looney"  . Statins Other (See Comments)  . Hydrocodone-Acetaminophen Nausea Only  . Tylenol [Acetaminophen] Other (See Comments)    Elevated Liver Enzymes    Histories Past Medical History:  Diagnosis Date  . Complication of anesthesia   . Difficulty sleeping    DUE TO PAIN  . Fibromyalgia   . GERD (gastroesophageal reflux disease)   . Hyperlipidemia   . IBS (irritable bowel syndrome)   . Osteoporosis   . PONV (postoperative nausea and vomiting)   . Prediabetes   . Venous hypertension    varicose veins   Past Surgical History:  Procedure Laterality Date  . ABDOMINAL HYSTERECTOMY    . BLADDER SUSPENSION  2008  . BREAST ENHANCEMENT SURGERY    . BTL    . CHOLECYSTECTOMY    . ENDOVENOUS ABLATION SAPHENOUS VEIN W/ LASER  02-08-2011  right greater saphenous vein    and sclerotherapy  right and left legs  . TONSILLECTOMY    . TOTAL HIP ARTHROPLASTY Left 01/18/2014   Procedure: LEFT TOTAL HIP ARTHROPLASTY ANTERIOR APPROACH;  Surgeon: Gearlean Alf, MD;  Location: [facility];  Service: Orthopedics;  Laterality: Left;   Social History   Socioeconomic History  . Marital status: Married    Spouse name: Not on file  . Number of children: Not on file  . Years of education: Not on file  . Highest education level: Not on file  Occupational  History  . Not on file  Tobacco Use  . Smoking status: Never Smoker  . Smokeless tobacco: Never Used  Substance and Sexual Activity  . Alcohol use: Yes    Comment: OCCASIONAL  . Drug use: No  . Sexual activity: Never  Other Topics Concern  . Not on file  Social History Narrative  . Not on file   Social Determinants of Health   Financial Resource Strain:   . Difficulty of Paying Living Expenses: Not on file  Food Insecurity:   . Worried About Charity fundraiser in the Last Year: Not on file  . Ran Out of Food in the Last Year: Not on file  Transportation Needs:   . Lack of Transportation (Medical): Not on file  . Lack of Transportation (Non-Medical): Not on file  Physical Activity:   . Days of Exercise per Week: Not on file  . Minutes of Exercise per Session: Not on file  Stress:   .  Feeling of Stress : Not on file  Social Connections:   . Frequency of Communication with Friends and Family: Not on file  . Frequency of Social Gatherings with Friends and Family: Not on file  . Attends Religious Services: Not on file  . Active Member of Clubs or Organizations: Not on file  . Attends Archivist Meetings: Not on file  . Marital Status: Not on file  Intimate Partner Violence:   . Fear of Current or Ex-Partner: Not on file  . Emotionally Abused: Not on file  . Physically Abused: Not on file  . Sexually Abused: Not on file   Family History  Problem Relation Age of Onset  . Mental illness Mother   . Cancer Mother   . Diabetes Father   . Cancer Father   . Diabetes Brother   . Colon cancer Neg Hx   . Esophageal cancer Neg Hx   . Inflammatory bowel disease Neg Hx   . Liver disease Neg Hx   . Pancreatic cancer Neg Hx   . Rectal cancer Neg Hx   . Stomach cancer Neg Hx    I have reviewed her medical, social, and family history in detail and updated the electronic medical record as necessary.    PHYSICAL EXAMINATION  BP 140/80   Pulse 84   Temp 98.1 F (36.7  C)   Ht 5\' 2"  (1.575 m)   Wt 151 lb (68.5 kg)   BMI 27.62 kg/m  Wt Readings from Last 3 Encounters:  05/26/19 151 lb (68.5 kg)  12/25/18 143 lb (64.9 kg)  01/18/14 156 lb (70.8 kg)  GEN: NAD, appears stated age, doesn't appear chronically ill PSYCH: Cooperative, without pressured speech EYE: Conjunctivae pink, sclerae anicteric ENT: MMM, without oral ulcers, no erythema or exudates noted NECK: Supple CV: RR without R/Gs  RESP: CTAB posteriorly, without wheezing GI: NABS, soft, mild tenderness to palpation in the left periumbilical region on deep palpation, ND, well-healed surgical scars present without rebound or guarding, no HSM appreciated MSK/EXT: No significant lower extremity edema SKIN: No jaundice NEURO:  Alert & Oriented x 3, no focal deficits   REVIEW OF DATA  I reviewed the following data at the time of this encounter:  GI Procedures and Studies  Reported colonoscopy within the last 5 years That she reports is normal  2017 endoscopy Reported hiatal hernia and H. pylori negative gastritis  Laboratory Studies  Reviewed those in epic and care everywhere  Imaging Studies  September 2020 solid-phase gastric emptying study [facility] IMPRESSION: Normal gastric emptying study. Gastric emptying is rather rapid. Significance of this finding uncertain.  September 2020 timed barium esophagram IMPRESSION: No definite evidence of mass or stricture is seen in the esophagus. Small sliding-type hiatal hernia is noted. Barium tablet passed through esophagus and into stomach without significant delay or difficulty. No significant standing column of contrast was noted with the patient in standing position.  July 2020 upper GI 1. Interval repair large paraesophageal hernia. 2. Small to moderate sized, sliding type hiatal hernia seen today, with reflux from the hernia into the lower esophagus seen on this exam. 3. Esophageal dysmotility as described above. 4. Additional  findings as detailed in the body of the report.  July 2020 CT abdomen pelvis without contrast 1. No definite CT evidence of acute abnormality of the abdomen or pelvis. 2. Age-indeterminate nondisplaced right fourth and fifth rib fractures. Correlate with point tenderness. 3. Postsurgical changes of hiatal hernia repair without evidence of  residual hernia by CT. 4. Colonic diverticulosis without evidence of acute diverticulitis.  May 2019 gastric emptying study IMPRESSION: Accelerated gastric emptying, with 93% emptying of standardized meal by 1 hour.  January 2019 CT abdomen pelvis with contrast IMPRESSION: 1. Large hiatal hernia containing much of the stomach, which is mildly rotated in its intrathoracic portion. 2. Upper normal amount of stool in the distal colon. 3. Exaggerated lumbar lordosis with lumbar spondylosis and degenerative disc disease causing mild levels of impingement at L4-5 and L5-S1. 4. Severe osteoarthritis right hip. Left hip prosthesis noted. 5. Aortic Atherosclerosis (ICD10-I70.0). 6. Several small benign-appearing cysts in the right mid kidney and in segment 2 of the liver.   ASSESSMENT  Ms. Wilkowski is a 79 y.o. female with a pmh significant for fibromyalgia, GERD (status post fundoplication), hiatal hernia (status post hernia repair), prediabetes, IBS-D, status post cholecystectomy.  The patient is seen today for evaluation and management of:  1. Chronic diarrhea   2. Irritable bowel syndrome with diarrhea   3. Bile salt-induced diarrhea   4. Bloating symptom   5. History of cholecystectomy   6. History of repair of hiatal hernia   7. History of fundoplication    The patient is clinically and hemodynamically stable.  She has had a longstanding history of chronic diarrhea which has been attributed to IBS-D versus bile salt diarrhea.  Although she has been treated with bile salt resins over the course of many years, it is not clear that we have  documentation of a complete work-up in regards to recent stool studies and/or evaluation of SIBO and EPI.  I would like to move forward with trying to obtain her prior colonoscopy reports to also ensure that she has been ruled out of microscopic/collagenous colitis because if she has not we need to do that as well.  Over the course of the coming weeks and months we will begin to try to further elucidate if there is an underlying etiology if not this may end up being her previously diagnosed functional GI disorder and we will continue therapy thereafter.  We are going to try to have her adjust her WelChol slightly and increase her up to anywhere between 2 and 4 pills/day and hopefully she will not have significant constipation as a result.  Fiber supplementation with FiberCon 1-2 times daily may also be considered.  She has significant bloating symptoms without overt abdominal distention and I have offered her the use of simethicone 125 mg 2-4 times daily.  She will try to do things on a basis of one thing at a time to try and see what may help her.  Based on the findings of her colonoscopy and how she does in the course of the coming weeks we will consider endoscopic evaluation.  All patient questions were answered, to the best of my ability, and the patient agrees to the aforementioned plan of action with follow-up as indicated.   PLAN  Stool studies as outlined below Fecal elastase to evaluate for EPI SIBO breath testing to be performed May use WelChol 2 to 4 pills daily Loperamide once daily 2-4 mg Obtain colonoscopy report and see if she has been ruled out in the past for microscopic/collagenous colitis Consider use of simethicone 125 mg (gas-X) 2-4 times daily   Orders Placed This Encounter  Procedures  . Clostridium difficile Toxin B, Qualitative, Real-Time PCR  . Pancreatic Elastase, Fecal    New Prescriptions   No medications on file   Modified Medications  No medications on file     Planned Follow Up No follow-ups on file.   Total Time in Face-to-Face and in Coordination of Care for patient including independent/personal interpretation/review of prior testing, medical history, examination, medication adjustment, communicating results with the patient directly, and documentation with the EHR is 50 minutes.   Justice Britain, MD [facility] Office # [phone]

## 2019-05-26 NOTE — Patient Instructions (Signed)
You have been given a testing kit to check for small intestine bacterial overgrowth (SIBO) which is completed by a company named [facility]. Make sure to return your test in the mail using the return mailing label given you along with the kit. Your demographic and insurance information have already been sent to the company and they should be in contact with you over the next week regarding this test. Please keep in mind that you will be getting a call from phone number [phone] or a similar number. If you do not hear from them within this time frame, please call our office at [phone].   Your provider has requested that you go to the basement level for lab work before leaving today. Press "B" on the elevator. The lab is located at the first door on the left as you exit the elevator.  May consider taking Fiber Con - once daily.   May increase Welchol from 2 pills daily up to 3-4 pills daily.   May use Gas-X 141m 2-3 times daily.   Thank you for choosing me and [facility].  Dr. MRush Landmark

## 2019-05-27 ENCOUNTER — Encounter: Payer: Self-pay | Admitting: Gastroenterology

## 2019-05-27 DIAGNOSIS — Z9889 Other specified postprocedural states: Secondary | ICD-10-CM | POA: Insufficient documentation

## 2019-05-27 DIAGNOSIS — Z8719 Personal history of other diseases of the digestive system: Secondary | ICD-10-CM | POA: Insufficient documentation

## 2019-05-27 DIAGNOSIS — R140 Abdominal distension (gaseous): Secondary | ICD-10-CM | POA: Insufficient documentation

## 2019-05-27 DIAGNOSIS — K529 Noninfective gastroenteritis and colitis, unspecified: Secondary | ICD-10-CM | POA: Insufficient documentation

## 2019-05-27 DIAGNOSIS — K580 Irritable bowel syndrome with diarrhea: Secondary | ICD-10-CM | POA: Insufficient documentation

## 2019-05-27 DIAGNOSIS — K9089 Other intestinal malabsorption: Secondary | ICD-10-CM | POA: Insufficient documentation

## 2019-05-27 DIAGNOSIS — Z9049 Acquired absence of other specified parts of digestive tract: Secondary | ICD-10-CM | POA: Insufficient documentation

## 2019-05-29 ENCOUNTER — Other Ambulatory Visit: Payer: Medicare Other

## 2019-05-29 DIAGNOSIS — Z8719 Personal history of other diseases of the digestive system: Secondary | ICD-10-CM

## 2019-05-29 DIAGNOSIS — Z9049 Acquired absence of other specified parts of digestive tract: Secondary | ICD-10-CM

## 2019-05-29 DIAGNOSIS — K529 Noninfective gastroenteritis and colitis, unspecified: Secondary | ICD-10-CM

## 2019-05-29 DIAGNOSIS — K580 Irritable bowel syndrome with diarrhea: Secondary | ICD-10-CM

## 2019-05-29 DIAGNOSIS — K9089 Other intestinal malabsorption: Secondary | ICD-10-CM

## 2019-06-06 LAB — PANCREATIC ELASTASE, FECAL: Pancreatic Elastase-1, Stool: >500 mcg/g

## 2019-06-06 LAB — CLOSTRIDIUM DIFFICILE TOXIN B, QUALITATIVE, REAL-TIME PCR: Toxigenic C. Difficile by PCR: NOT DETECTED

## 2019-07-16 ENCOUNTER — Ambulatory Visit: Payer: Medicare Other | Admitting: Gastroenterology

## 2019-07-16 ENCOUNTER — Encounter: Payer: Self-pay | Admitting: Gastroenterology

## 2019-07-16 VITALS — BP 110/70 | HR 72 | Temp 98.4°F | Ht 62.0 in | Wt 155.0 lb

## 2019-07-16 DIAGNOSIS — K9089 Other intestinal malabsorption: Secondary | ICD-10-CM

## 2019-07-16 DIAGNOSIS — K529 Noninfective gastroenteritis and colitis, unspecified: Secondary | ICD-10-CM

## 2019-07-16 DIAGNOSIS — K580 Irritable bowel syndrome with diarrhea: Secondary | ICD-10-CM | POA: Diagnosis not present

## 2019-07-16 DIAGNOSIS — M6289 Other specified disorders of muscle: Secondary | ICD-10-CM

## 2019-07-16 DIAGNOSIS — R159 Full incontinence of feces: Secondary | ICD-10-CM

## 2019-07-16 NOTE — Progress Notes (Signed)
[place] VISIT   Primary Care Provider [place], [place] (Inactive) No address on file [phone]  Patient Profile: Dominique Hawkins is a 79 y.o. female with a pmh significant for fibromyalgia, GERD (status post fundoplication), hiatal hernia (status post hernia repair), prediabetes, IBS-D, status post cholecystectomy.  The patient presents to the [facility] for an evaluation and management of problem(s) noted below:  Problem List 1. 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 Pelvic floor dysfunction in female   3. Chronic diarrhea   4. Irritable bowel syndrome with diarrhea   5. Bile salt-induced diarrhea     History of Present Illness Please see initial consultation note for full details of HPI.    Interval History The patient returns for scheduled follow-up.  She is using WelChol up to 3 pills daily.  She is still having bowel movements that range from diarrhea like to constipation and hard.  She has anywhere between 1 and 3 bowel movements a day.  The patient states that she is wondering what can be done for her fecal soilage/fecal incontinence which occurs a few times per week.  Patient has a history of 2 vaginal births.  No other anorectal interventions other than colonoscopies.  As previously stated she had a prior colonoscopy but we were not able to get those records and we are not clear that she has ever been ruled out for microscopic/collagenous colitis.  No blood in the stools.  She does have a history of chronic back pain and this has been exacerbating over the course the last few months.  Her back issues stem in the lower lumbar region.  She has sciatica-like discomfort at times.  Patient continues to have issues of bloating with occasional Gas-X use.  No melena or hematochezia.  The patient has finally been able to get in touch with her insurance and knows that they will pay 80% of the cost for the SIBO breath test but she has  not completed that as of yet because she did not know with the entire cost would be from the manufacture.  She was negative on her fecal elastase testing.  GI Review of Systems Positive as above Negative for odynophagia, pyrosis, nausea, vomiting, early satiety  Review of Systems General: Denies fevers/chills/weight loss Cardiovascular: Denies chest pain/palpitations Pulmonary: Denies shortness of breath/cough Gastroenterological: See HPI Genitourinary: Denies darkened urine Hematological: Denies easy bruising/bleeding Dermatological: Denies jaundice Psychological: Mood is stable   Medications Current Outpatient Medications  Medication Sig Dispense Refill  . Cholecalciferol (VITAMIN D-3 PO) Take 1 tablet by mouth daily.    . colesevelam (WELCHOL) 625 MG tablet Take 1 tablet by mouth 3 (three) times daily.    Marland Kitchen loperamide (IMODIUM) 2 MG capsule Take by mouth daily.    . Multiple Vitamins-Minerals (MULTIVITAMIN GUMMIES WOMENS PO) Take 1 tablet by mouth daily.    . rosuvastatin (CRESTOR) 5 MG tablet Take 5 mg by mouth daily.     No current facility-administered medications for this visit.    Allergies Allergies  Allergen Reactions  . Codeine Nausea And Vomiting, Itching, Nausea Only and Other (See Comments)    "Can't get up" "Can't get up" "Can't get up"   . Tramadol Other (See Comments)    "Looney"  . Statins Other (See Comments)  . Hydrocodone-Acetaminophen Nausea Only  . Tylenol [Acetaminophen] Other (See Comments)    Elevated Liver Enzymes    Histories Past Medical History:  Diagnosis Date  . Complication of  anesthesia   . Difficulty sleeping    DUE TO PAIN  . Fibromyalgia   . GERD (gastroesophageal reflux disease)   . Hyperlipidemia   . IBS (irritable bowel syndrome)   . Osteoporosis   . PONV (postoperative nausea and vomiting)   . Prediabetes   . Venous hypertension    varicose veins   Past Surgical History:  Procedure Laterality Date  . ABDOMINAL  HYSTERECTOMY    . BLADDER SUSPENSION  2008  . BREAST ENHANCEMENT SURGERY    . BTL    . CHOLECYSTECTOMY    . ENDOVENOUS ABLATION SAPHENOUS VEIN W/ LASER  02-08-2011  right greater saphenous vein    and sclerotherapy  right and left legs  . TONSILLECTOMY    . TOTAL HIP ARTHROPLASTY Left 01/18/2014   Procedure: LEFT TOTAL HIP ARTHROPLASTY ANTERIOR APPROACH;  Surgeon: Gearlean Alf, MD;  Location: [facility];  Service: Orthopedics;  Laterality: Left;   Social History   Socioeconomic History  . Marital status: Married    Spouse name: Not on file  . Number of children: 2  . Years of education: Not on file  . Highest education level: Not on file  Occupational History  . Occupation: accounting  Tobacco Use  . Smoking status: Never Smoker  . Smokeless tobacco: Never Used  Substance and Sexual Activity  . Alcohol use: Yes    Comment: OCCASIONAL  . Drug use: No  . Sexual activity: Never  Other Topics Concern  . Not on file  Social History Narrative  . Not on file   Social Determinants of Health   Financial Resource Strain:   . Difficulty of Paying Living Expenses:   Food Insecurity:   . Worried About Charity fundraiser in the Last Year:   . Arboriculturist in the Last Year:   Transportation Needs:   . Film/video editor (Medical):   Marland Kitchen Lack of Transportation (Non-Medical):   Physical Activity:   . Days of Exercise per Week:   . Minutes of Exercise per Session:   Stress:   . Feeling of Stress :   Social Connections:   . Frequency of Communication with Friends and Family:   . Frequency of Social Gatherings with Friends and Family:   . Attends Religious Services:   . Active Member of Clubs or Organizations:   . Attends Archivist Meetings:   Marland Kitchen Marital Status:   Intimate Partner Violence:   . Fear of Current or Ex-Partner:   . Emotionally Abused:   Marland Kitchen Physically Abused:   . Sexually Abused:    Family History  Problem Relation Age of Onset  . Mental illness  Mother   . Cancer Mother   . Diabetes Father   . Cancer Father   . Diabetes Brother   . Colon cancer Neg Hx   . Esophageal cancer Neg Hx   . Inflammatory bowel disease Neg Hx   . Liver disease Neg Hx   . Pancreatic cancer Neg Hx   . Rectal cancer Neg Hx   . Stomach cancer Neg Hx    I have reviewed her medical, social, and family history in detail and updated the electronic medical record as necessary.    PHYSICAL EXAMINATION  BP 110/70   Pulse 72   Temp 98.4 F (36.9 C)   Ht 5\' 2"  (1.575 m)   Wt 155 lb (70.3 kg)   BMI 28.35 kg/m  Wt Readings from Last 3 Encounters:  07/16/19  155 lb (70.3 kg)  05/26/19 151 lb (68.5 kg)  12/25/18 143 lb (64.9 kg)  GEN: NAD, appears stated age, doesn't appear chronically ill PSYCH: Cooperative, without pressured speech EYE: Conjunctivae pink, sclerae anicteric ENT: MMM CV: RR without R/Gs  RESP: CTAB posteriorly, without wheezing GI: NABS, soft, mildly protuberant, minimal tenderness to palpation, no rebound or guarding  GU: Perineal exam consistent with skin tag and external hemorrhoid with likely prolapse internal hemorrhoid, DRE consistent with decreased rectal tone but normal pelvic descent and likely palpation of internal hemorrhoids with evidence of brown formed stool MSK/EXT: No significant lower extremity edema SKIN: No jaundice NEURO:  Alert & Oriented x 3, no focal deficits   REVIEW OF DATA  I reviewed the following data at the time of this encounter:  GI Procedures and Studies  Previously reviewed although unable to see colonoscopy report  Laboratory Studies  Reviewed those in epic and care everywhere  Imaging Studies  No new imaging studies to review   ASSESSMENT  Ms. Felmlee is a 80 y.o. female with a pmh significant for fibromyalgia, GERD (status post fundoplication), hiatal hernia (status post hernia repair), prediabetes, IBS-D, status post cholecystectomy.  The patient is seen today for evaluation and management  of:  1. 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 Pelvic floor dysfunction in female   3. Chronic diarrhea   4. Irritable bowel syndrome with diarrhea   5. Bile salt-induced diarrhea    The patient is hemodynamically stable.  The patient's clinical history from prior was 1 of potential IBS-D with potential bile salt diarrhea as well.  The patient described today some increased issues of fecal soilage and fecal incontinence.  Her exam although we have not done an anorectal manometry probe, is suggestive of potential pelvic floor dysfunction.  We discussed the role of Kegel exercises as well as pelvic floor biofeedback and retraining with pelvic floor physical therapy.  After discussion with the patient, she is amenable to proceeding with pelvic floor PT and we will plan on referring her.  She will continue her current WelChol dosing between 2 and 4 pills/day.  She will continue fiber supplementation once daily.  Simethicone 125 mg 2-4 times daily will be continued as well as needed.  I discussed that at some point in the future may or may want to move forward with at least a colonoscopy for diagnostic purposes to ensure she does not have microscopic or collagenous colitis.  With that being said, the patient's current bowel movement looked to be more formed in quality rather than diarrhea like.  We will see how she is doing in the course of the next 6 to 8 weeks after pelvic floor biofeedback.  Reasonable still to move forward with SIBO breath testing if she likes and will have the company reach out to her to give her a sense of what the cost will be.  All patient questions were answered, to the best of my ability, and the patient agrees to the aforementioned plan of action with follow-up as indicated.   PLAN  Continue to use WelChol 2 to 4 pills daily Loperamide once daily 4 mg SIBO breath testing to be performed May continue using simethicone 1 and 25 mg 2-4 times daily as needed Pelvic floor  physical therapy for biofeedback referral placed In future would consider colonoscopy with microscopic/collagenous colitis biopsies for rule out   Orders Placed This Encounter  Procedures  . Ambulatory referral to Physical Therapy  New Prescriptions   No medications on file   Modified Medications   No medications on file    Planned Follow Up Return in about 6 weeks (around 08/27/2019).   Total Time in Face-to-Face and in Coordination of Care for patient including independent/personal interpretation/review of prior testing, medical history, examination, medication adjustment, communicating results with the patient directly, and documentation with the EHR is 30 minutes.    Justice Britain, MD [facility] Office # [id number]

## 2019-07-16 NOTE — Patient Instructions (Addendum)
Toileting tips to help with your constipation - Drink at least 64-80 ounces of water/liquid per day. - Establish a time to try to move your bowels every day.  For many people, this is after a cup of coffee or after a meal such as breakfast. - Sit all of the way back on the toilet keeping your back fairly straight and while sitting up, try to rest the tops of your forearms on your upper thighs.   - Raising your feet with a step stool/squatty potty can be helpful to improve the angle that allows your stool to pass through the rectum. - Relax the rectum feeling it bulge toward the toilet water.  If you feel your rectum raising toward your body, you are contracting rather than relaxing. - Breathe in and slowly exhale. "Belly breath" by expanding your belly towards your belly button. Keep belly expanded as you gently direct pressure down and back to the anus.  A low pitched GRRR sound can assist with increasing intra-abdominal pressure.  - Repeat 3-4 times. If unsuccessful, contract the pelvic floor to restore normal tone and get off the toilet.  Avoid excessive straining. - To reduce excessive wiping by teaching your anus to normally contract, place hands on outer aspect of knees and resist knee movement outward.  Hold 5-10 second then place hands just inside of knees and resist inward movement of knees.  Hold 5 seconds.  Repeat a few times each way.  We are sending in a referral to pelvic floor therapy. They will call you to schedule.   You have been given a testing kit to check for small intestine bacterial overgrowth (SIBO) which is completed by a company named [employer]. Make sure to return your test in the mail using the return mailing label given to you along with the kit. Your demographic and insurance information have already been sent to the company and they should be in contact with you over the next week regarding this test. [employer] will collect an upfront charge of $99.74 for  commercial insurance plans and $209.74 is you are paying cash. Make sure to discuss with [facility] PRIOR to having the test if they have gotten informatoin from your insurance company as to how much your testing will cost out of pocket, if any. Please keep in mind that you will be getting a call from phone number [phone] or a similar number. If you do not hear from them within this time frame, please call our office at [phone].

## 2019-07-17 ENCOUNTER — Encounter: Payer: Self-pay | Admitting: Gastroenterology

## 2019-07-17 DIAGNOSIS — M6289 Other specified disorders of muscle: Secondary | ICD-10-CM | POA: Insufficient documentation

## 2019-07-17 DIAGNOSIS — R159 Full incontinence of feces: Secondary | ICD-10-CM | POA: Insufficient documentation

## 2019-07-21 ENCOUNTER — Other Ambulatory Visit: Payer: Self-pay

## 2019-07-21 ENCOUNTER — Ambulatory Visit: Payer: Medicare Other | Admitting: Orthopaedic Surgery

## 2019-07-21 ENCOUNTER — Ambulatory Visit: Payer: Self-pay

## 2019-07-21 DIAGNOSIS — M1611 Unilateral primary osteoarthritis, right hip: Secondary | ICD-10-CM | POA: Diagnosis not present

## 2019-07-21 NOTE — Progress Notes (Signed)
Office Visit Note   Patient: Dominique Hawkins           Date of Birth: May 25, 1940           MRN: [id number] Visit Date: 07/21/2019              Requested by: No referring provider defined for this encounter. PCP: Jeannine Boga (Inactive)   Assessment & Plan: Visit Diagnoses:  1. Primary osteoarthritis of right hip     Plan: Due to the fact the patient's failed conservative treatment which is included time, NSAID medications and intra-articular injection and continues to have pain in right hip that is affecting her quality of life.  Recommend right total hip arthroplasty.  Risk benefits of surgery discussed with patient at length.  Risk include but are not limited to nerve/vessel injury, infection, prolonged pain, DVT/PE and wound healing problems.  Questions were encouraged and answered by Dr. Ninfa Linden self.  We will proceed with surgery in the near future.  She will follow-up with [place] 2 weeks postop.  Follow-Up Instructions: Return 2 weeks post op.   Orders:  Orders Placed This Encounter  Procedures  . XR HIP UNILAT W OR W/O PELVIS 1V RIGHT   No orders of the defined types were placed in this encounter.     Procedures: No procedures performed   Clinical Data: No additional findings.   Subjective: Chief Complaint  Patient presents with  . Right Hip - Pain    HPI Dominique Hawkins is a 79 year old female comes in today for right hip pain for about a year.  She has tried over-the-counter NSAIDs and intra-articular injections in the hip last May.  She continues to have right groin pain.  She has difficulty trimming her toenails and has difficulty getting up /down from a seated position.  She has had no known injury.  History of left total hip arthroplasty 01/18/2014 done by another physician who obtained on did well.  Only complication was postop nausea due to pain medications.  She notes that she has not found any medications that she can really take for pain that do not cause  severe nausea or vomiting.  Review of Systems  Constitutional: Positive for activity change. Negative for chills and fever.  Respiratory: Negative for shortness of breath.   Cardiovascular: Negative for chest pain.  Musculoskeletal: Positive for arthralgias and back pain.     Objective: Vital Signs: There were no vitals taken for this visit.  Physical Exam Constitutional:      Appearance: She is not ill-appearing or diaphoretic.  Pulmonary:     Effort: Pulmonary effort is normal.  Neurological:     Mental Status: She is alert and oriented to person, place, and time.  Psychiatric:        Mood and Affect: Mood normal.     Ortho Exam Left hip good range of motion without pain.  Right hip she has limited internal and external rotation with pain.  Calves are supple nontender.  Ambulates without an assistive device.  Dorsiflexion plantarflexion bilateral ankles intact. Specialty Comments:  No specialty comments available.  Imaging: No results found.   PMFS History: Patient Active Problem List   Diagnosis Date Noted  . 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 Pelvic floor dysfunction in female 07/17/2019  . Chronic diarrhea 05/27/2019  . Irritable bowel syndrome with diarrhea 05/27/2019  . Bile salt-induced diarrhea 05/27/2019  . Bloating symptom 05/27/2019  . History of cholecystectomy 05/27/2019  .  History of repair of hiatal hernia 05/27/2019  . History of fundoplication [id number]  . Sensorineural hearing loss (SNHL) of both ears 03/25/2019  . IFG (impaired fasting glucose) 02/04/2019  . Lumbar degenerative disc disease 07/22/2017  . Lumbar spondylosis 07/22/2017  . Epigastric pain 07/04/2017  . Age related osteoporosis 11/24/2015  . Gastroesophageal reflux disease without esophagitis 11/24/2015  . Mixed hyperlipidemia 11/24/2015  . Vitamin D deficiency 11/24/2015  . OA (osteoarthritis) of hip 01/18/2014  . Varicose veins of lower extremities with  other complications [id number]   Past Medical History:  Diagnosis Date  . Complication of anesthesia   . Difficulty sleeping    DUE TO PAIN  . Fibromyalgia   . GERD (gastroesophageal reflux disease)   . Hyperlipidemia   . IBS (irritable bowel syndrome)   . Osteoporosis   . PONV (postoperative nausea and vomiting)   . Prediabetes   . Venous hypertension    varicose veins    Family History  Problem Relation Age of Onset  . Mental illness Mother   . Cancer Mother   . Diabetes Father   . Cancer Father   . Diabetes Brother   . Colon cancer Neg Hx   . Esophageal cancer Neg Hx   . Inflammatory bowel disease Neg Hx   . Liver disease Neg Hx   . Pancreatic cancer Neg Hx   . Rectal cancer Neg Hx   . Stomach cancer Neg Hx     Past Surgical History:  Procedure Laterality Date  . ABDOMINAL HYSTERECTOMY    . BLADDER SUSPENSION  2008  . BREAST ENHANCEMENT SURGERY    . BTL    . CHOLECYSTECTOMY    . ENDOVENOUS ABLATION SAPHENOUS VEIN W/ LASER  02-08-2011  right greater saphenous vein    and sclerotherapy  right and left legs  . TONSILLECTOMY    . TOTAL HIP ARTHROPLASTY Left 01/18/2014   Procedure: LEFT TOTAL HIP ARTHROPLASTY ANTERIOR APPROACH;  Surgeon: Gearlean Alf, MD;  Location: [facility];  Service: Orthopedics;  Laterality: Left;   Social History   Occupational History  . Occupation: accounting  Tobacco Use  . Smoking status: Never Smoker  . Smokeless tobacco: Never Used  Substance and Sexual Activity  . Alcohol use: Yes    Comment: OCCASIONAL  . Drug use: No  . Sexual activity: Never

## 2019-07-22 ENCOUNTER — Encounter: Payer: Self-pay | Admitting: Orthopaedic Surgery

## 2019-07-23 ENCOUNTER — Ambulatory Visit: Payer: Medicare Other | Admitting: Physical Therapy

## 2019-07-29 ENCOUNTER — Other Ambulatory Visit: Payer: Self-pay

## 2019-08-18 NOTE — Progress Notes (Signed)
Need orders in epic. Surgery on 09/04/19.  preop on 5/26 at 100pm.

## 2019-08-19 ENCOUNTER — Other Ambulatory Visit: Payer: Self-pay

## 2019-08-19 ENCOUNTER — Encounter (HOSPITAL_COMMUNITY)
Admission: RE | Admit: 2019-08-19 | Discharge: 2019-08-19 | Disposition: A | Discharge status: Home / Self Care | Payer: Medicare Other | Source: Ambulatory Visit | Attending: Orthopaedic Surgery | Admitting: Orthopaedic Surgery

## 2019-08-19 ENCOUNTER — Other Ambulatory Visit: Payer: Self-pay | Admitting: Physician Assistant

## 2019-08-19 ENCOUNTER — Encounter (HOSPITAL_COMMUNITY): Payer: Self-pay

## 2019-08-19 DIAGNOSIS — Z01818 Encounter for other preprocedural examination: Secondary | ICD-10-CM | POA: Insufficient documentation

## 2019-08-19 HISTORY — DX: Malignant (primary) neoplasm, unspecified: C80.1

## 2019-08-19 HISTORY — DX: Unspecified osteoarthritis, unspecified site: M19.90

## 2019-08-19 NOTE — Patient Instructions (Signed)
DUE TO COVID-19 ONLY ONE VISITOR IS ALLOWED TO COME WITH YOU AND STAY IN THE WAITING ROOM ONLY DURING PRE OP AND PROCEDURE DAY OF SURGERY. THE 1 VISITOR MAY VISIT WITH YOU AFTER SURGERY IN YOUR PRIVATE ROOM DURING VISITING HOURS ONLY!  YOU NEED TO HAVE A COVID 19 TEST ON: 09/01/19 @ 3:00 pm, THIS TEST MUST BE DONE BEFORE SURGERY, COME  Dominique Hawkins , [id number].  ([place]) ONCE YOUR COVID TEST IS COMPLETED, PLEASE BEGIN THE QUARANTINE INSTRUCTIONS AS OUTLINED IN YOUR HANDOUT.                Dominique Hawkins    Your procedure is scheduled on: 09/04/19   Report to [facility] Main  Entrance   Report to admitting at: 6:00 AM     Call this number if you have problems the morning of surgery [phone]    Remember:   NO SOLID FOOD AFTER MIDNIGHT THE NIGHT PRIOR TO SURGERY. NOTHING BY MOUTH EXCEPT CLEAR LIQUIDS UNTIL: 5:30 am .    CLEAR LIQUID DIET   Foods Allowed                                                                     Foods Excluded  Coffee and tea, regular and decaf                             liquids that you cannot  Plain Jell-O any favor except red or purple                                           see through such as: Fruit ices (not with fruit pulp)                                     milk, soups, orange juice  Iced Popsicles                                    All solid food Carbonated beverages, regular and diet                                    Cranberry, grape and apple juices Sports drinks like Gatorade Lightly seasoned clear broth or consume(fat free) Sugar, honey syrup  Sample Menu Breakfast                                Lunch                                     Supper Cranberry juice                    Beef broth  Chicken broth Jell-O                                     Grape juice                           Apple juice Coffee or tea                        Jell-O                                       Popsicle                                                Coffee or tea                        Coffee or tea  _____________________________________________________________________   BRUSH YOUR TEETH MORNING OF SURGERY AND RINSE YOUR MOUTH OUT, NO CHEWING GUM CANDY OR MINTS.                                   You may not have any metal on your body including hair pins and              piercings  Do not wear jewelry, make-up, lotions, powders or perfumes, deodorant             Do not wear nail polish on your fingernails.  Do not shave  48 hours prior to surgery.                Do not bring valuables to the hospital. [place].  Contacts, dentures or bridgework may not be worn into surgery.  Leave suitcase in the car. After surgery it may be brought to your room.     Patients discharged the day of surgery will not be allowed to drive home. IF YOU ARE HAVING SURGERY AND GOING HOME THE SAME DAY, YOU MUST HAVE AN ADULT TO DRIVE YOU HOME AND BE WITH YOU FOR 24 HOURS. YOU MAY GO HOME BY TAXI OR UBER OR ORTHERWISE, BUT AN ADULT MUST ACCOMPANY YOU HOME AND STAY WITH YOU FOR 24 HOURS.  Name and phone number of your driver:  Special Instructions: N/A              Please read over the following fact sheets you were given: _____________________________________________________________________  [facility] - Preparing for Surgery Before surgery, you can play an important role.  Because skin is not sterile, your skin needs to be as free of germs as possible.  You can reduce the number of germs on your skin by washing with CHG (chlorahexidine gluconate) soap before surgery.  CHG is an antiseptic cleaner which kills germs and bonds with the skin to continue killing germs even after washing. Please DO NOT use if you have an allergy to CHG or antibacterial soaps.  If your skin becomes reddened/irritated  stop using the CHG and inform your nurse when you  arrive at Short Stay. Do not shave (including legs and underarms) for at least 48 hours prior to the first CHG shower.  You may shave your face/neck. Please follow these instructions carefully:  1.  Shower with CHG Soap the night before surgery and the  morning of Surgery.  2.  If you choose to wash your hair, wash your hair first as usual with your  normal  shampoo.  3.  After you shampoo, rinse your hair and body thoroughly to remove the  shampoo.                           4.  Use CHG as you would any other liquid soap.  You can apply chg directly  to the skin and wash                       Gently with a scrungie or clean washcloth.  5.  Apply the CHG Soap to your body ONLY FROM THE NECK DOWN.   Do not use on face/ open                           Wound or open sores. Avoid contact with eyes, ears mouth and genitals (private parts).                       Wash face,  Genitals (private parts) with your normal soap.             6.  Wash thoroughly, paying special attention to the area where your surgery  will be performed.  7.  Thoroughly rinse your body with warm water from the neck down.  8.  DO NOT shower/wash with your normal soap after using and rinsing off  the CHG Soap.                9.  Pat yourself dry with a clean towel.            10.  Wear clean pajamas.            11.  Place clean sheets on your bed the night of your first shower and do not  sleep with pets. Day of Surgery : Do not apply any lotions/deodorants the morning of surgery.  Please wear clean clothes to the hospital/surgery center.  FAILURE TO FOLLOW THESE INSTRUCTIONS MAY RESULT IN THE CANCELLATION OF YOUR SURGERY PATIENT SIGNATURE_________________________________  NURSE SIGNATURE__________________________________  ________________________________________________________________________

## 2019-08-19 NOTE — Progress Notes (Addendum)
PCP - Dr. Lourdes Sledge. LOV: 08/05/19 Cardiologist -   Chest x-ray -  EKG - 08/06/19 Stress Test -  ECHO -  Cardiac Cath -   Sleep Study -  CPAP -   Fasting Blood Sugar -  Checks Blood Sugar _____ times a day  Blood Thinner Instructions: Aspirin Instructions: Last Dose:  Anesthesia review:   Patient denies shortness of breath, fever, cough and chest pain at PAT appointment   Patient verbalized understanding of instructions that were given to them at the PAT appointment. Patient was also instructed that they will need to review over the PAT instructions again at home before surgery.

## 2019-08-27 ENCOUNTER — Ambulatory Visit: Payer: Medicare Other | Admitting: Physical Therapy

## 2019-08-31 ENCOUNTER — Other Ambulatory Visit: Payer: Self-pay

## 2019-08-31 ENCOUNTER — Encounter (HOSPITAL_COMMUNITY)
Admission: RE | Admit: 2019-08-31 | Discharge: 2019-08-31 | Disposition: A | Discharge status: Home / Self Care | Payer: Medicare Other | Source: Ambulatory Visit | Attending: Orthopaedic Surgery | Admitting: Orthopaedic Surgery

## 2019-08-31 DIAGNOSIS — Z01812 Encounter for preprocedural laboratory examination: Secondary | ICD-10-CM | POA: Insufficient documentation

## 2019-08-31 LAB — CBC
HCT: 40.3 % (ref 36.0–46.0)
Hemoglobin: 13.1 g/dL (ref 12.0–15.0)
MCH: 28.5 pg (ref 26.0–34.0)
MCHC: 32.5 g/dL (ref 30.0–36.0)
MCV: 87.8 fL (ref 80.0–100.0)
Platelets: 181 10*3/uL (ref 150–400)
RBC: 4.59 MIL/uL (ref 3.87–5.11)
RDW: 13.1 % (ref 11.5–15.5)
WBC: 4.5 10*3/uL (ref 4.0–10.5)
nRBC: 0 % (ref 0.0–0.2)

## 2019-08-31 LAB — SURGICAL PCR SCREEN
MRSA, PCR: NEGATIVE
Staphylococcus aureus: NEGATIVE

## 2019-09-01 ENCOUNTER — Other Ambulatory Visit (HOSPITAL_COMMUNITY)
Admission: RE | Admit: 2019-09-01 | Discharge: 2019-09-01 | Disposition: A | Discharge status: Home / Self Care | Payer: Medicare Other | Source: Ambulatory Visit | Attending: Orthopaedic Surgery | Admitting: Orthopaedic Surgery

## 2019-09-01 DIAGNOSIS — Z01812 Encounter for preprocedural laboratory examination: Secondary | ICD-10-CM | POA: Diagnosis present

## 2019-09-01 DIAGNOSIS — Z20822 Contact with and (suspected) exposure to covid-19: Secondary | ICD-10-CM | POA: Diagnosis not present

## 2019-09-01 LAB — SARS CORONAVIRUS 2 (TAT 6-24 HRS): SARS Coronavirus 2: NEGATIVE

## 2019-09-03 DIAGNOSIS — M1611 Unilateral primary osteoarthritis, right hip: Secondary | ICD-10-CM

## 2019-09-03 NOTE — Anesthesia Preprocedure Evaluation (Addendum)
Anesthesia Evaluation  Patient identified by MRN, date of birth, ID band Patient awake    Reviewed: Allergy & Precautions, NPO status , Patient's Chart, lab work & pertinent test results  History of Anesthesia Complications (+) PONV and history of anesthetic complications  Airway Mallampati: II  TM Distance: >3 FB Neck ROM: Full    Dental  (+) Dental Advisory Given, Upper Dentures   Pulmonary neg pulmonary ROS,    Pulmonary exam normal breath sounds clear to auscultation       Cardiovascular Exercise Tolerance: Good negative cardio ROS Normal cardiovascular exam Rhythm:Regular Rate:Normal     Neuro/Psych negative neurological ROS  negative psych ROS   GI/Hepatic Neg liver ROS, GERD  ,  Endo/Other  negative endocrine ROS  Renal/GU negative Renal ROS     Musculoskeletal  (+) Arthritis , Fibromyalgia -  Abdominal   Peds  Hematology negative hematology ROS (+) Plt 181k   Anesthesia Other Findings   Reproductive/Obstetrics                            Anesthesia Physical Anesthesia Plan  ASA: II  Anesthesia Plan: Spinal   Post-op Pain Management:    Induction: Intravenous  PONV Risk Score and Plan: 3 and Propofol infusion, Ondansetron and Treatment may vary due to age or medical condition  Airway Management Planned: Natural Airway and Nasal Cannula  Additional Equipment:   Intra-op Plan:   Post-operative Plan:   Informed Consent: I have reviewed the patients History and Physical, chart, labs and discussed the procedure including the risks, benefits and alternatives for the proposed anesthesia with the patient or authorized representative who has indicated his/her understanding and acceptance.     Dental advisory given  Plan Discussed with: CRNA  Anesthesia Plan Comments:        Anesthesia Quick Evaluation

## 2019-09-03 NOTE — H&P (Signed)
TOTAL HIP ADMISSION H&P  Patient is admitted for right total hip arthroplasty.  Subjective:  Chief Complaint: right hip pain  HPI: Dominique Hawkins, 79 y.o. female, has a history of pain and functional disability in the right hip(s) due to arthritis and patient has failed non-surgical conservative treatments for greater than 12 weeks to include NSAID's and/or analgesics, corticosteriod injections, flexibility and strengthening excercises, use of assistive devices, weight reduction as appropriate and activity modification.  Onset of symptoms was gradual starting 3 years ago with gradually worsening course since that time.The patient noted no past surgery on the right hip(s).  Patient currently rates pain in the right hip at 10 out of 10 with activity. Patient has night pain, worsening of pain with activity and weight bearing, trendelenberg gait, pain that interfers with activities of daily living and pain with passive range of motion. Patient has evidence of subchondral sclerosis, periarticular osteophytes and joint space narrowing by imaging studies. This condition presents safety issues increasing the risk of falls.  There is no current active infection.  Patient Active Problem List   Diagnosis Date Noted  . Unilateral primary osteoarthritis, right hip 09/03/2019  . 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 Pelvic floor dysfunction in female 07/17/2019  . Chronic diarrhea 05/27/2019  . Irritable bowel syndrome with diarrhea 05/27/2019  . Bile salt-induced diarrhea 05/27/2019  . Bloating symptom 05/27/2019  . History of cholecystectomy 05/27/2019  . History of repair of hiatal hernia 05/27/2019  . History of fundoplication 93/81/0175  . Sensorineural hearing loss (SNHL) of both ears 03/25/2019  . IFG (impaired fasting glucose) 02/04/2019  . Lumbar degenerative disc disease 07/22/2017  . Lumbar spondylosis 07/22/2017  . Epigastric pain 07/04/2017  . Age related osteoporosis  11/24/2015  . Gastroesophageal reflux disease without esophagitis 11/24/2015  . Mixed hyperlipidemia 11/24/2015  . Vitamin D deficiency 11/24/2015  . OA (osteoarthritis) of hip 01/18/2014  . Varicose veins of lower extremities with other complications 01/17/8526   Past Medical History:  Diagnosis Date  . Arthritis   . Cancer ([place])    skin cancer on both legs  . Complication of anesthesia   . Difficulty sleeping    DUE TO PAIN  . Fibromyalgia   . GERD (gastroesophageal reflux disease)   . Hyperlipidemia   . IBS (irritable bowel syndrome)   . Osteoporosis   . PONV (postoperative nausea and vomiting)   . Prediabetes   . Venous hypertension    varicose veins    Past Surgical History:  Procedure Laterality Date  . ABDOMINAL HYSTERECTOMY    . BLADDER SUSPENSION  2008  . BREAST ENHANCEMENT SURGERY    . BTL    . CHOLECYSTECTOMY    . ENDOVENOUS ABLATION SAPHENOUS VEIN W/ LASER  02-08-2011  right greater saphenous vein    and sclerotherapy  right and left legs  . HERNIA REPAIR     hiatal  . SHOULDER ARTHROSCOPY Left   . TONSILLECTOMY    . TOTAL HIP ARTHROPLASTY Left 01/18/2014   Procedure: LEFT TOTAL HIP ARTHROPLASTY ANTERIOR APPROACH;  Surgeon: Gearlean Alf, MD;  Location: [facility];  Service: Orthopedics;  Laterality: Left;    No current facility-administered medications for this encounter.   Current Outpatient Medications  Medication Sig Dispense Refill Last Dose  . cholecalciferol (VITAMIN D3) 25 MCG (1000 UNIT) tablet Take 2,000 Units by mouth daily.     . colesevelam (WELCHOL) 625 MG tablet Take 1,875 mg by mouth daily.      Marland Kitchen  loperamide (IMODIUM) 2 MG capsule Take 2 mg by mouth daily.      . Multiple Vitamins-Minerals (MULTIVITAMIN GUMMIES WOMENS PO) Take 2 tablets by mouth daily.      . naproxen sodium (ALEVE) 220 MG tablet Take 220 mg by mouth daily as needed (pain).     . rosuvastatin (CRESTOR) 5 MG tablet Take 2.5 mg by mouth daily.      . TURMERIC PO Take 2 tablets  by mouth daily.      Allergies  Allergen Reactions  . Codeine Itching, Nausea And Vomiting and Other (See Comments)    "Can't get up"    . Tramadol Other (See Comments)    "Looney"  . Statins Other (See Comments)    Leg cramps  . Hydrocodone-Acetaminophen Nausea Only  . Tylenol [Acetaminophen] Other (See Comments)    Elevated Liver Enzymes    Social History   Tobacco Use  . Smoking status: Never Smoker  . Smokeless tobacco: Never Used  Substance Use Topics  . Alcohol use: Yes    Comment: OCCASIONAL    Family History  Problem Relation Age of Onset  . Mental illness Mother   . Cancer Mother   . Diabetes Father   . Cancer Father   . Diabetes Brother   . Colon cancer Neg Hx   . Esophageal cancer Neg Hx   . Inflammatory bowel disease Neg Hx   . Liver disease Neg Hx   . Pancreatic cancer Neg Hx   . Rectal cancer Neg Hx   . Stomach cancer Neg Hx      Review of Systems  All other systems reviewed and are negative.   Objective:  Physical Exam  Constitutional: She is oriented to person, place, and time.  HENT:  Head: Normocephalic and atraumatic.  Eyes: Pupils are equal, round, and reactive to light.  Cardiovascular: Normal rate and normal pulses.  Respiratory: Effort normal.  GI: Soft. Normal appearance.  Musculoskeletal:     Cervical back: Normal range of motion.     Right hip: Tenderness and bony tenderness present. Decreased range of motion. Decreased strength.  Neurological: She is alert and oriented to person, place, and time.  Skin: Skin is warm and dry.  Psychiatric: Her behavior is normal.    Vital signs in last 24 hours:    Labs:   Estimated body mass index is 28.22 kg/m as calculated from the following:   Height as of 08/31/19: 5\' 2"  (1.575 m).   Weight as of 08/31/19: 70 kg.   Imaging Review Plain radiographs demonstrate severe degenerative joint disease of the right hip(s). The bone quality appears to be good for age and reported activity  level.      Assessment/Plan:  End stage arthritis, right hip(s)  The patient history, physical examination, clinical judgement of the provider and imaging studies are consistent with end stage degenerative joint disease of the right hip(s) and total hip arthroplasty is deemed medically necessary. The treatment options including medical management, injection therapy, arthroscopy and arthroplasty were discussed at length. The risks and benefits of total hip arthroplasty were presented and reviewed. The risks due to aseptic loosening, infection, stiffness, dislocation/subluxation,  thromboembolic complications and other imponderables were discussed.  The patient acknowledged the explanation, agreed to proceed with the plan and consent was signed. Patient is being admitted for inpatient treatment for surgery, pain control, PT, OT, prophylactic antibiotics, VTE prophylaxis, progressive ambulation and ADL's and discharge planning.The patient is planning to be discharged home with  home health services

## 2019-09-04 ENCOUNTER — Ambulatory Visit (HOSPITAL_COMMUNITY): Payer: Medicare Other

## 2019-09-04 ENCOUNTER — Observation Stay (HOSPITAL_COMMUNITY): Payer: Medicare Other

## 2019-09-04 ENCOUNTER — Other Ambulatory Visit: Payer: Self-pay

## 2019-09-04 ENCOUNTER — Ambulatory Visit (HOSPITAL_COMMUNITY): Payer: Medicare Other | Admitting: Anesthesiology

## 2019-09-04 ENCOUNTER — Encounter (HOSPITAL_COMMUNITY): Payer: Self-pay | Admitting: Orthopaedic Surgery

## 2019-09-04 ENCOUNTER — Encounter (HOSPITAL_COMMUNITY)
Admission: RE | Disposition: A | Discharge status: Home Health | Payer: Self-pay | Source: Ambulatory Visit | Attending: Orthopaedic Surgery

## 2019-09-04 ENCOUNTER — Observation Stay (HOSPITAL_COMMUNITY)
Admission: RE | Admit: 2019-09-04 | Discharge: 2019-09-05 | Disposition: A | Discharge status: Home Health | Payer: Medicare Other | Source: Ambulatory Visit | Attending: Orthopaedic Surgery | Admitting: Orthopaedic Surgery

## 2019-09-04 DIAGNOSIS — M797 Fibromyalgia: Secondary | ICD-10-CM | POA: Insufficient documentation

## 2019-09-04 DIAGNOSIS — H903 Sensorineural hearing loss, bilateral: Secondary | ICD-10-CM | POA: Diagnosis not present

## 2019-09-04 DIAGNOSIS — Z96641 Presence of right artificial hip joint: Secondary | ICD-10-CM

## 2019-09-04 DIAGNOSIS — K219 Gastro-esophageal reflux disease without esophagitis: Secondary | ICD-10-CM | POA: Insufficient documentation

## 2019-09-04 DIAGNOSIS — Z79899 Other long term (current) drug therapy: Secondary | ICD-10-CM | POA: Diagnosis not present

## 2019-09-04 DIAGNOSIS — E782 Mixed hyperlipidemia: Secondary | ICD-10-CM | POA: Diagnosis not present

## 2019-09-04 DIAGNOSIS — M1611 Unilateral primary osteoarthritis, right hip: Principal | ICD-10-CM

## 2019-09-04 DIAGNOSIS — M810 Age-related osteoporosis without current pathological fracture: Secondary | ICD-10-CM | POA: Insufficient documentation

## 2019-09-04 DIAGNOSIS — Z85828 Personal history of other malignant neoplasm of skin: Secondary | ICD-10-CM | POA: Diagnosis not present

## 2019-09-04 DIAGNOSIS — Z96642 Presence of left artificial hip joint: Secondary | ICD-10-CM | POA: Insufficient documentation

## 2019-09-04 DIAGNOSIS — Z419 Encounter for procedure for purposes other than remedying health state, unspecified: Secondary | ICD-10-CM

## 2019-09-04 HISTORY — PX: TOTAL HIP ARTHROPLASTY: SHX124

## 2019-09-04 LAB — TYPE AND SCREEN
ABO/RH(D): AB POS
Antibody Screen: NEGATIVE

## 2019-09-04 SURGERY — ARTHROPLASTY, HIP, TOTAL, ANTERIOR APPROACH
Anesthesia: Spinal | Site: Hip | Laterality: Right

## 2019-09-04 MED ORDER — ONDANSETRON HCL 4 MG/2ML IJ SOLN
4.0000 mg | Freq: Four times a day (QID) | INTRAMUSCULAR | Status: DC | PRN
  Administered 2019-09-04 – 2019-09-05 (×2): 4 mg via INTRAVENOUS
  Filled 2019-09-04 (×2): qty 2

## 2019-09-04 MED ORDER — ONDANSETRON HCL 4 MG PO TABS
4.0000 mg | ORAL_TABLET | Freq: Four times a day (QID) | ORAL | Status: DC | PRN
  Filled 2019-09-04: qty 1

## 2019-09-04 MED ORDER — METHOCARBAMOL 500 MG PO TABS
500.0000 mg | ORAL_TABLET | Freq: Four times a day (QID) | ORAL | Status: DC | PRN
  Administered 2019-09-05: 500 mg via ORAL
  Filled 2019-09-04: qty 1

## 2019-09-04 MED ORDER — ONDANSETRON HCL 4 MG/2ML IJ SOLN
INTRAMUSCULAR | Status: DC | PRN
  Administered 2019-09-04: 4 mg via INTRAVENOUS

## 2019-09-04 MED ORDER — PROPOFOL 10 MG/ML IV BOLUS
INTRAVENOUS | Status: AC
  Filled 2019-09-04: qty 20

## 2019-09-04 MED ORDER — SODIUM CHLORIDE 0.9 % IR SOLN
Status: DC | PRN
  Administered 2019-09-04: 1000 mL

## 2019-09-04 MED ORDER — POVIDONE-IODINE 10 % EX SWAB
2.0000 "application " | Freq: Once | CUTANEOUS | Status: AC
  Administered 2019-09-04: 2 via TOPICAL

## 2019-09-04 MED ORDER — ROSUVASTATIN CALCIUM 5 MG PO TABS
2.5000 mg | ORAL_TABLET | Freq: Every day | ORAL | Status: DC
  Administered 2019-09-04: 2.5 mg via ORAL
  Filled 2019-09-04 (×2): qty 1

## 2019-09-04 MED ORDER — FENTANYL CITRATE (PF) 100 MCG/2ML IJ SOLN: 25.0000 ug | INTRAMUSCULAR | Status: DC | PRN

## 2019-09-04 MED ORDER — DEXAMETHASONE SODIUM PHOSPHATE 10 MG/ML IJ SOLN
INTRAMUSCULAR | Status: AC
  Filled 2019-09-04: qty 1

## 2019-09-04 MED ORDER — TRANEXAMIC ACID-NACL 1000-0.7 MG/100ML-% IV SOLN
1000.0000 mg | INTRAVENOUS | Status: AC
  Administered 2019-09-04: 1000 mg via INTRAVENOUS
  Filled 2019-09-04: qty 100

## 2019-09-04 MED ORDER — ORAL CARE MOUTH RINSE: 15.0000 mL | Freq: Once | OROMUCOSAL | Status: AC

## 2019-09-04 MED ORDER — PANTOPRAZOLE SODIUM 40 MG PO TBEC
40.0000 mg | DELAYED_RELEASE_TABLET | Freq: Every day | ORAL | Status: DC
  Administered 2019-09-04 – 2019-09-05 (×2): 40 mg via ORAL
  Filled 2019-09-04 (×2): qty 1

## 2019-09-04 MED ORDER — GABAPENTIN 100 MG PO CAPS
100.0000 mg | ORAL_CAPSULE | Freq: Three times a day (TID) | ORAL | Status: DC
  Administered 2019-09-04 (×2): 100 mg via ORAL
  Filled 2019-09-04 (×3): qty 1

## 2019-09-04 MED ORDER — OXYCODONE HCL 5 MG PO TABS
5.0000 mg | ORAL_TABLET | ORAL | Status: DC | PRN
  Administered 2019-09-04: 10 mg via ORAL
  Administered 2019-09-04: 5 mg via ORAL
  Filled 2019-09-04: qty 2
  Filled 2019-09-04 (×2): qty 1

## 2019-09-04 MED ORDER — LACTATED RINGERS IV SOLN: INTRAVENOUS | Status: DC

## 2019-09-04 MED ORDER — STERILE WATER FOR IRRIGATION IR SOLN
Status: DC | PRN
  Administered 2019-09-04: 2000 mL

## 2019-09-04 MED ORDER — CEFAZOLIN SODIUM-DEXTROSE 1-4 GM/50ML-% IV SOLN
1.0000 g | Freq: Four times a day (QID) | INTRAVENOUS | Status: AC
  Administered 2019-09-04 (×2): 1 g via INTRAVENOUS
  Filled 2019-09-04 (×2): qty 50

## 2019-09-04 MED ORDER — ONDANSETRON HCL 4 MG/2ML IJ SOLN: 4.0000 mg | Freq: Once | INTRAMUSCULAR | Status: DC | PRN

## 2019-09-04 MED ORDER — MIDAZOLAM HCL 2 MG/2ML IJ SOLN
INTRAMUSCULAR | Status: AC
  Filled 2019-09-04: qty 2

## 2019-09-04 MED ORDER — ONDANSETRON HCL 4 MG/2ML IJ SOLN
INTRAMUSCULAR | Status: AC
  Filled 2019-09-04: qty 2

## 2019-09-04 MED ORDER — ASPIRIN 81 MG PO CHEW
81.0000 mg | CHEWABLE_TABLET | Freq: Two times a day (BID) | ORAL | Status: DC
  Administered 2019-09-04 – 2019-09-05 (×2): 81 mg via ORAL
  Filled 2019-09-04 (×2): qty 1

## 2019-09-04 MED ORDER — MENTHOL 3 MG MT LOZG: 1.0000 | LOZENGE | OROMUCOSAL | Status: DC | PRN

## 2019-09-04 MED ORDER — DEXAMETHASONE SODIUM PHOSPHATE 10 MG/ML IJ SOLN
INTRAMUSCULAR | Status: DC | PRN
  Administered 2019-09-04: 8 mg via INTRAVENOUS

## 2019-09-04 MED ORDER — METOCLOPRAMIDE HCL 5 MG PO TABS: 5.0000 mg | ORAL_TABLET | Freq: Three times a day (TID) | ORAL | Status: DC | PRN

## 2019-09-04 MED ORDER — OXYCODONE HCL 5 MG PO TABS: 10.0000 mg | ORAL_TABLET | ORAL | Status: DC | PRN

## 2019-09-04 MED ORDER — METOCLOPRAMIDE HCL 5 MG/ML IJ SOLN: 5.0000 mg | Freq: Three times a day (TID) | INTRAMUSCULAR | Status: DC | PRN

## 2019-09-04 MED ORDER — PHENYLEPHRINE 40 MCG/ML (10ML) SYRINGE FOR IV PUSH (FOR BLOOD PRESSURE SUPPORT)
PREFILLED_SYRINGE | INTRAVENOUS | Status: DC | PRN
  Administered 2019-09-04: 120 ug via INTRAVENOUS

## 2019-09-04 MED ORDER — 0.9 % SODIUM CHLORIDE (POUR BTL) OPTIME
TOPICAL | Status: DC | PRN
  Administered 2019-09-04: 1000 mL

## 2019-09-04 MED ORDER — MIDAZOLAM HCL 2 MG/2ML IJ SOLN
INTRAMUSCULAR | Status: DC | PRN
  Administered 2019-09-04: 2 mg via INTRAVENOUS

## 2019-09-04 MED ORDER — HYDROMORPHONE HCL 1 MG/ML IJ SOLN
0.5000 mg | INTRAMUSCULAR | Status: DC | PRN
  Administered 2019-09-04: 1 mg via INTRAVENOUS
  Filled 2019-09-04: qty 1

## 2019-09-04 MED ORDER — PROPOFOL 10 MG/ML IV BOLUS
INTRAVENOUS | Status: DC | PRN
  Administered 2019-09-04: 20 mg via INTRAVENOUS

## 2019-09-04 MED ORDER — CHLORHEXIDINE GLUCONATE 0.12 % MT SOLN
15.0000 mL | Freq: Once | OROMUCOSAL | Status: AC
  Administered 2019-09-04: 15 mL via OROMUCOSAL

## 2019-09-04 MED ORDER — PROPOFOL 1000 MG/100ML IV EMUL
INTRAVENOUS | Status: AC
  Filled 2019-09-04: qty 100

## 2019-09-04 MED ORDER — VITAMIN D 25 MCG (1000 UNIT) PO TABS
2000.0000 [IU] | ORAL_TABLET | Freq: Every day | ORAL | Status: DC
  Administered 2019-09-04: 2000 [IU] via ORAL
  Filled 2019-09-04 (×2): qty 2

## 2019-09-04 MED ORDER — PHENOL 1.4 % MT LIQD: 1.0000 | OROMUCOSAL | Status: DC | PRN

## 2019-09-04 MED ORDER — ALUM & MAG HYDROXIDE-SIMETH 200-200-20 MG/5ML PO SUSP: 30.0000 mL | ORAL | Status: DC | PRN

## 2019-09-04 MED ORDER — PHENYLEPHRINE 40 MCG/ML (10ML) SYRINGE FOR IV PUSH (FOR BLOOD PRESSURE SUPPORT)
PREFILLED_SYRINGE | INTRAVENOUS | Status: AC
  Filled 2019-09-04: qty 10

## 2019-09-04 MED ORDER — CEFAZOLIN SODIUM-DEXTROSE 2-4 GM/100ML-% IV SOLN
2.0000 g | INTRAVENOUS | Status: AC
  Administered 2019-09-04: 2 g via INTRAVENOUS
  Filled 2019-09-04: qty 100

## 2019-09-04 MED ORDER — COLESEVELAM HCL 625 MG PO TABS
1875.0000 mg | ORAL_TABLET | Freq: Every day | ORAL | Status: DC
  Administered 2019-09-04 – 2019-09-05 (×2): 1875 mg via ORAL
  Filled 2019-09-04 (×2): qty 3

## 2019-09-04 MED ORDER — DOCUSATE SODIUM 100 MG PO CAPS
100.0000 mg | ORAL_CAPSULE | Freq: Two times a day (BID) | ORAL | Status: DC
  Administered 2019-09-04 – 2019-09-05 (×2): 100 mg via ORAL
  Filled 2019-09-04 (×2): qty 1

## 2019-09-04 MED ORDER — SODIUM CHLORIDE 0.9 % IV SOLN: INTRAVENOUS | Status: DC

## 2019-09-04 MED ORDER — METHOCARBAMOL 500 MG IVPB - SIMPLE MED
500.0000 mg | Freq: Four times a day (QID) | INTRAVENOUS | Status: DC | PRN
  Filled 2019-09-04: qty 50

## 2019-09-04 MED ORDER — DIPHENHYDRAMINE HCL 12.5 MG/5ML PO ELIX: 12.5000 mg | ORAL_SOLUTION | ORAL | Status: DC | PRN

## 2019-09-04 MED ORDER — PROPOFOL 500 MG/50ML IV EMUL
INTRAVENOUS | Status: DC | PRN
  Administered 2019-09-04: 100 ug/kg/min via INTRAVENOUS

## 2019-09-04 MED ORDER — ACETAMINOPHEN 325 MG PO TABS: 325.0000 mg | ORAL_TABLET | Freq: Four times a day (QID) | ORAL | Status: DC | PRN

## 2019-09-04 SURGICAL SUPPLY — 49 items
BAG ZIPLOCK 12X15 (MISCELLANEOUS) IMPLANT
BENZOIN TINCTURE PRP APPL 2/3 (GAUZE/BANDAGES/DRESSINGS) IMPLANT
BLADE SAW SGTL 18X1.27X75 (BLADE) ×2 IMPLANT
BLADE SAW SGTL 18X1.27X75MM (BLADE) ×1
CLOSURE WOUND 1/2 X4 (GAUZE/BANDAGES/DRESSINGS)
COVER PERINEAL POST (MISCELLANEOUS) ×3 IMPLANT
COVER SURGICAL LIGHT HANDLE (MISCELLANEOUS) ×3 IMPLANT
COVER WAND RF STERILE (DRAPES) ×3 IMPLANT
CUP SECTOR GRIPTON 50MM (Cup) ×3 IMPLANT
DERMABOND ADVANCED (GAUZE/BANDAGES/DRESSINGS) ×2
DERMABOND ADVANCED .7 DNX12 (GAUZE/BANDAGES/DRESSINGS) ×1 IMPLANT
DRAPE STERI IOBAN 125X83 (DRAPES) ×3 IMPLANT
DRAPE U-SHAPE 47X51 STRL (DRAPES) ×6 IMPLANT
DRESSING AQUACEL AG SP 3.5X10 (GAUZE/BANDAGES/DRESSINGS) ×1 IMPLANT
DRSG AQUACEL AG ADV 3.5X10 (GAUZE/BANDAGES/DRESSINGS) ×3 IMPLANT
DRSG AQUACEL AG SP 3.5X10 (GAUZE/BANDAGES/DRESSINGS) ×3
DRSG CURAD 3X16 NADH (PACKING) ×3 IMPLANT
DURAPREP 26ML APPLICATOR (WOUND CARE) ×3 IMPLANT
ELECT REM PT RETURN 15FT ADLT (MISCELLANEOUS) ×3 IMPLANT
GAUZE XEROFORM 1X8 LF (GAUZE/BANDAGES/DRESSINGS) ×3 IMPLANT
GLOVE BIO SURGEON STRL SZ7.5 (GLOVE) ×3 IMPLANT
GLOVE BIOGEL PI IND STRL 8 (GLOVE) ×2 IMPLANT
GLOVE BIOGEL PI INDICATOR 8 (GLOVE) ×4
GLOVE ECLIPSE 8.0 STRL XLNG CF (GLOVE) ×3 IMPLANT
GOWN STRL REUS W/TWL XL LVL3 (GOWN DISPOSABLE) ×6 IMPLANT
HANDPIECE INTERPULSE COAX TIP (DISPOSABLE) ×3
HEAD FEM STD 32X+1 STRL (Hips) ×3 IMPLANT
HOLDER FOLEY CATH W/STRAP (MISCELLANEOUS) ×3 IMPLANT
KIT TURNOVER KIT A (KITS) IMPLANT
LINER ACET PNNCL PLUS4 NEUTRAL (Hips) ×1 IMPLANT
PACK ANTERIOR HIP CUSTOM (KITS) ×3 IMPLANT
PENCIL SMOKE EVACUATOR (MISCELLANEOUS) ×3 IMPLANT
PINNACLE PLUS 4 NEUTRAL (Hips) ×3 IMPLANT
SET HNDPC FAN SPRY TIP SCT (DISPOSABLE) ×1 IMPLANT
STAPLER VISISTAT 35W (STAPLE) ×3 IMPLANT
STEM CORAIL KA11 (Stem) ×3 IMPLANT
STRIP CLOSURE SKIN 1/2X4 (GAUZE/BANDAGES/DRESSINGS) IMPLANT
SUT ETHIBOND NAB CT1 #1 30IN (SUTURE) ×3 IMPLANT
SUT ETHILON 2 0 PS N (SUTURE) IMPLANT
SUT FIBERWIRE #2 38 REV NDL BL (SUTURE) ×3
SUT MNCRL AB 4-0 PS2 18 (SUTURE) IMPLANT
SUT VIC AB 0 CT1 36 (SUTURE) ×3 IMPLANT
SUT VIC AB 1 CT1 36 (SUTURE) ×3 IMPLANT
SUT VIC AB 2-0 CT1 27 (SUTURE) ×6
SUT VIC AB 2-0 CT1 TAPERPNT 27 (SUTURE) ×2 IMPLANT
SUTURE FIBERWR#2 38 REV NDL BL (SUTURE) ×1 IMPLANT
TRAY FOLEY MTR SLVR 14FR STAT (SET/KITS/TRAYS/PACK) ×3 IMPLANT
TRAY FOLEY MTR SLVR 16FR STAT (SET/KITS/TRAYS/PACK) IMPLANT
YANKAUER SUCT BULB TIP 10FT TU (MISCELLANEOUS) ×3 IMPLANT

## 2019-09-04 NOTE — Interval H&P Note (Signed)
History and Physical Interval Note: The patient understands fully we are proceeding with a right total hip arthroplasty today to treat her right hip osteoarthritis.  There is been no acute change in her medical status.  Please see previous H&P.  The risk and benefits of surgery been discussed in detail and informed consent is obtained.  The right hip is been marked.  09/04/2019 7:02 AM  Dominique Hawkins  has presented today for surgery, with the diagnosis of end stage osteoarthritis right hip.  The various methods of treatment have been discussed with the patient and family. After consideration of risks, benefits and other options for treatment, the patient has consented to  Procedure(s): RIGHT TOTAL HIP ARTHROPLASTY ANTERIOR APPROACH (Right) as a surgical intervention.  The patient's history has been reviewed, patient examined, no change in status, stable for surgery.  I have reviewed the patient's chart and labs.  Questions were answered to the patient's satisfaction.     Mcarthur Rossetti

## 2019-09-04 NOTE — Plan of Care (Signed)
Plan of care 

## 2019-09-04 NOTE — Anesthesia Procedure Notes (Signed)
Procedure Name: MAC Date/Time: 09/04/2019 8:13 AM Performed by: Niel Hummer, CRNA Pre-anesthesia Checklist: Patient identified, Emergency Drugs available, Suction available and Patient being monitored Oxygen Delivery Method: Simple face mask

## 2019-09-04 NOTE — Anesthesia Postprocedure Evaluation (Signed)
Anesthesia Post Note  Patient: Dominique Hawkins  Procedure(s) Performed: RIGHT TOTAL HIP ARTHROPLASTY ANTERIOR APPROACH (Right Hip)     Patient location during evaluation: PACU Anesthesia Type: Spinal Level of consciousness: oriented and awake and alert Pain management: pain level controlled Vital Signs Assessment: post-procedure vital signs reviewed and stable Respiratory status: spontaneous breathing, respiratory function stable and nonlabored ventilation Cardiovascular status: blood pressure returned to baseline and stable Postop Assessment: no headache, no backache, no apparent nausea or vomiting, patient able to bend at knees and spinal receding Anesthetic complications: no   No complications documented.  Last Vitals:  Vitals:   09/04/19 1127 09/04/19 1233  BP: 134/68 137/66  Pulse: (!) 56 67  Resp: 14 16  Temp: 36.5 C 36.8 C  SpO2: 100% 100%    Last Pain:  Vitals:   09/04/19 1233  TempSrc: Oral  PainSc:                  Catalina Gravel

## 2019-09-04 NOTE — Transfer of Care (Signed)
Immediate Anesthesia Transfer of Care Note  Patient: Dominique Hawkins  Procedure(s) Performed: RIGHT TOTAL HIP ARTHROPLASTY ANTERIOR APPROACH (Right Hip)  Patient Location: PACU  Anesthesia Type:Spinal  Level of Consciousness: awake, alert  and oriented  Airway & Oxygen Therapy: Patient Spontanous Breathing and Patient connected to face mask oxygen  Post-op Assessment: Report given to RN and Post -op Vital signs reviewed and stable  Post vital signs: Reviewed and stable  Last Vitals:  Vitals Value Taken Time  BP    Temp    Pulse 82 09/04/19 0959  Resp 14 09/04/19 0959  SpO2 99 % 09/04/19 0959  Vitals shown include unvalidated device data.  Last Pain:  Vitals:   09/04/19 0656  TempSrc:   PainSc: 0-No pain         Complications: No complications documented.

## 2019-09-04 NOTE — Brief Op Note (Signed)
09/04/2019  9:43 AM  PATIENT:  Dominique Hawkins  79 y.o. female  PRE-OPERATIVE DIAGNOSIS:  end stage osteoarthritis right hip  POST-OPERATIVE DIAGNOSIS:  end stage osteoarthritis right hip  PROCEDURE:  Procedure(s): RIGHT TOTAL HIP ARTHROPLASTY ANTERIOR APPROACH (Right)  SURGEON:  Surgeon(s) and Role:    Mcarthur Rossetti, MD - Primary  PHYSICIAN ASSISTANT: Benita Stabile, PA-C  ANESTHESIA:   spinal  EBL:  200 mL   COUNTS:  YES  DICTATION: .Other Dictation: Dictation Number [id number]  PLAN OF CARE: Admit for overnight observation  PATIENT DISPOSITION:  PACU - hemodynamically stable.   Delay start of Pharmacological VTE agent (>24hrs) due to surgical blood loss or risk of bleeding: no

## 2019-09-04 NOTE — Evaluation (Signed)
Physical Therapy Evaluation Patient Details Name: Dominique Hawkins MRN: [id number] DOB: 1940-06-01 Today's Date: 09/04/2019   History of Present Illness  Pt is 79 yo female with PMH including arthritis, fibromyalgia, GERD, hyperlipidemia, IBS, osteoporosis, post operative N/V, L THA, and prediabetes.  Pt admitted for R hip OA and is s/p R anterior THA on 09/04/19.  Pt with operative complication of fracture of lesser trochanter repaired with FiberWire suture.  Pt still with WBAT status.  Clinical Impression  Pt is s/p R anterior THA on DOS resulting in the deficits listed below (see PT Problem List). Pt was able to transfer and ambulate 22' with RW and min A.  She required cues for transfer and gait technique.  Pt did report mild dizziness and with pallor/diaphoresis with ambulation but VSS; pt does have hx of post op nausea/vomitting.  Pt with good support at home and good rehab potential. Pt will benefit from skilled PT to increase their independence and safety with mobility to allow discharge to the venue listed below.      Follow Up Recommendations Follow surgeons recommendation for DC plan and follow-up therapies;Supervision/Assistance - 24 hour    Equipment Recommendations  3in1 (PT);Rolling walker with 5" wheels    Recommendations for Other Services       Precautions / Restrictions Precautions Precautions: Fall Restrictions Weight Bearing Restrictions: Yes RLE Weight Bearing: Weight bearing as tolerated      Mobility  Bed Mobility Overal bed mobility: Needs Assistance Bed Mobility: Supine to Sit     Supine to sit: Min assist;HOB elevated     General bed mobility comments: Use of bed rails, cues for sequencing, assist for R LE and to elevate trunk  Transfers Overall transfer level: Needs assistance Equipment used: Rolling walker (2 wheeled) Transfers: Sit to/from Stand Sit to Stand: Min assist         General transfer comment: cues for safe hand placement and to  come up on L LE and gradually increase weight on R  Ambulation/Gait Ambulation/Gait assistance: Min guard Gait Distance (Feet): 22 Feet Assistive device: Rolling walker (2 wheeled) Gait Pattern/deviations: Step-to pattern;Decreased stride length;Decreased stance time - right;Decreased weight shift to right Gait velocity: decreased   General Gait Details: Cues for sequence and RW use; limited distance due to pain and nausea  Stairs            Wheelchair Mobility    Modified Rankin (Stroke Patients Only)       Balance Overall balance assessment: Needs assistance Sitting-balance support: No upper extremity supported;Feet supported Sitting balance-Leahy Scale: Good     Standing balance support: Single extremity supported;During functional activity Standing balance-Leahy Scale: Fair Standing balance comment: Pt able to take 1 hand off walker to assist with donning shorts                             Pertinent Vitals/Pain Pain Assessment: 0-10 Pain Score: 5  Pain Location: R hip Pain Descriptors / Indicators: Sharp Pain Intervention(s): Premedicated before session;Monitored during session;Limited activity within patient's tolerance;Ice applied    Home Living Family/patient expects to be discharged to:: Private residence Living Arrangements: Spouse/significant other Available Help at Discharge: Family;Available 24 hours/day Type of Home: House Home Access: Stairs to enter Entrance Stairs-Rails: None Entrance Stairs-Number of Steps: 1 threshold Home Layout: One level Home Equipment: Cane - single point Additional Comments: Reports ordered RW and BSC    Prior Function Level of Independence: Independent  Comments: Able to ambulate in community, perform ADLs, and IADLs     Hand Dominance        Extremity/Trunk Assessment   Upper Extremity Assessment Upper Extremity Assessment: Overall WFL for tasks assessed    Lower Extremity  Assessment Lower Extremity Assessment: LLE deficits/detail;RLE deficits/detail RLE Deficits / Details: ROM: WFL, hip limited due to pain but functional; MMT: ankle 5/5, knee 3/5, hip 1/5 limited by pain LLE Deficits / Details: ROM WFL; MMT 5/5    Cervical / Trunk Assessment Cervical / Trunk Assessment: Normal  Communication   Communication: No difficulties  Cognition Arousal/Alertness: Awake/alert Behavior During Therapy: WFL for tasks assessed/performed Overall Cognitive Status: Within Functional Limits for tasks assessed                                        General Comments General comments (skin integrity, edema, etc.):  Pt on RA with stable O2 sats. Pt reports min dizziness with sitting (did just receive dilaudid) with HR 79 and BP 116/64. Noted pt diaphoretic and pale after walking BP was 112/67 and HR 76. Pt reports that she feels ok (just sleepy) after reclined in chair.  Pt educated on PT role, POC, exercises, and use of gait belt to assist with ROM.    Exercises Total Joint Exercises Ankle Circles/Pumps: AROM;10 reps;Both;Seated Quad Sets: AROM;Both;5 reps;Seated   Assessment/Plan    PT Assessment Patient needs continued PT services  PT Problem List Decreased strength;Decreased mobility;Decreased range of motion;Decreased coordination;Decreased activity tolerance;Decreased balance;Decreased knowledge of use of DME;Pain       PT Treatment Interventions DME instruction;Therapeutic activities;Modalities;Gait training;Therapeutic exercise;Patient/family education;Stair training;Balance training;Functional mobility training    PT Goals (Current goals can be found in the Care Plan section)  Acute Rehab PT Goals Patient Stated Goal: return home PT Goal Formulation: With patient/family Time For Goal Achievement: 09/18/19 Potential to Achieve Goals: Good    Frequency 7X/week   Barriers to discharge        Co-evaluation               AM-PAC PT  "6 Clicks" Mobility  Outcome Measure Help needed turning from your back to your side while in a flat bed without using bedrails?: A Little Help needed moving from lying on your back to sitting on the side of a flat bed without using bedrails?: A Little Help needed moving to and from a bed to a chair (including a wheelchair)?: A Little Help needed standing up from a chair using your arms (e.g., wheelchair or bedside chair)?: A Little Help needed to walk in hospital room?: A Little Help needed climbing 3-5 steps with a railing? : A Lot 6 Click Score: 17    End of Session Equipment Utilized During Treatment: Gait belt Activity Tolerance: Patient limited by lethargy Patient left: with chair alarm set;in chair;with call bell/phone within reach;with family/visitor present Nurse Communication: Mobility status PT Visit Diagnosis: Unsteadiness on feet (R26.81);Muscle weakness (generalized) (M62.81);Pain Pain - Right/Left: Right Pain - part of body: Hip    Time: 3428-7681 PT Time Calculation (min) (ACUTE ONLY): 28 min   Charges:   PT Evaluation $PT Eval Moderate Complexity: 1 Mod PT Treatments $Therapeutic Activity: 8-22 mins        Abran Richard, PT Acute Rehab Services Pager [phone] [facility] [phone]    Karlton Lemon 09/04/2019, 3:41 PM

## 2019-09-04 NOTE — Op Note (Signed)
NAMEAKYIA, BORELLI MEDICAL RECORD [id number] ACCOUNT [id number] DATE OF BIRTH:[birth year] FACILITY: [facility] LOCATION: [facility] PHYSICIAN:Michaline Kindig Kerry Fort, MD  OPERATIVE REPORT  DATE OF PROCEDURE:  09/04/2019  PREOPERATIVE DIAGNOSIS:  Primary osteoarthritis and degenerative joint disease, right hip.  POSTOPERATIVE DIAGNOSIS:  Primary osteoarthritis and degenerative joint disease, right hip.  PROCEDURE:  Right total hip arthroplasty through direct anterior approach.  IMPLANTS:  DePuy Sector Gription acetabular component size 50, size 36+4 neutral polyethylene liner, size 11 Corail femoral component with standard offset, size 32+1 metal hip ball.  SURGEON:  Lind Guest. Ninfa Linden, MD  ASSISTANT:  Erskine Emery, PA-C  ANESTHESIA:  Spinal.  ANTIBIOTICS:  Two grams IV Ancef.  ESTIMATED BLOOD LOSS:  200 mL.  COMPLICATIONS:  Fracture through the lesser trochanter that was easily repaired with FiberWire suture with no instability of the femoral component.  INDICATIONS:  The patient is a very pleasant 79 year old female with debilitating arthritis involving her right hip.  She actually has a left total hip arthroplasty that was done by one of my colleagues in town in 2013 and she was very pleased with that  hip.  I am not sure if it is for insurance reasons, but she needed to see me for this hip.  Her right hip pain is daily and has detrimentally affected her mobility, her quality of life, and activities of daily living to the point she does wish to proceed  with a total hip arthroplasty on the right side.  She has severe end-stage arthritis of the right hip radiographically and clinically.  Her hip is very stiff and painful on exam.  With surgery, she understands the risk of acute blood loss anemia, nerve  or vessel injury, fracture, infection, dislocation, DVT and implant failure.  She understands our goals are to decrease pain, improve mobility and overall improve quality of  life.  DESCRIPTION OF PROCEDURE:  After informed consent was obtained and appropriate right hip was marked she was brought to the operating room and sat up on a stretcher.  Spinal anesthesia was then obtained.  She was then laid in supine position on a  stretcher.  Foley catheter was placed.  I was able to assess her leg lengths and found that she is definitely shorter on her right side than the left.  Although radiographically, she looks like she is on, clinically, she is shorter, so we knew we needed  to lengthen her.  Traction boots were placed on both her feet.  She was placed supine on the Hana fracture table, the perineal post in place and both legs in line skeletal traction device and no traction applied.  Her right operative hip was prepped and  draped with DuraPrep and sterile drapes.  A timeout was called and she was identified as correct patient, correct right hip.  I then made an incision just inferior and posterior to the anterior iliac spine and carried this obliquely down the leg.  We  dissected down to tensor fascia lata muscle.  Tensor fascia was then divided longitudinally to proceed with direct anterior approach to the hip.  We identified and cauterized circumflex vessels and identified the hip capsule, opened the hip capsule in an  L-type format, finding moderate joint effusion and significant arthritis around the femoral head and neck.  I placed Cobra retractors around the medial and lateral femoral neck and made our femoral neck cut with an oscillating saw and completed this  with an osteotome.  This was proximal to the lesser trochanter.  We then placed a corkscrew guide in the femoral head and removed the femoral head in its entirety and it came out easily.  There was a large area devoid of cartilage.  I then placed a bent  Hohmann over the medial acetabular rim and removed remnants of the acetabular labrum and other debris.  I then began reaming under direct visualization from a  size 44 reamer in stepwise increments up to a size 49 with all reamers under direct  visualization, the last reamer under direct fluoroscopy.  This was so we could obtain our depth of reaming, our inclination and anteversion.  I then was pleased with the position, so we placed the real DePuy Sector Gription acetabular component size 50  and a 32+4 neutral polyethylene liner for that size acetabular component.  Attention was then turned to the femur.  With the leg externally rotated to 120 degrees, extended and adducted, we replaced Mueller retractor medially and Hohman retractor behind  the greater trochanter, we released lateral joint capsule and used a box-cutting osteotome to enter the femoral canal and a rongeur to lateralize then began broaching using the Corail broaching system from a size 8 going up to a size 10.  With a size 10  in place, I placed the calcar planer but as we did the calcar planer, it did cause a fracture of the lesser trochanter.  I was then able to easily upsize her femoral component to an 11 and then I was able to assess the stability of the hip and it was  very stable, so I placed a 32+1 hip ball and brought the leg back over and up in traction, internal rotation.  It reduced into the pelvis easily and it was stable throughout its arc of motion.  I then dislocated the hip and removed the trial components.   I then placed the real size 11 femoral component with standard offset which was a Corail very easily.  I then was able to reapproximate the lesser trochanter piece and used a FiberWire suture to secure this back down easily.  I did place some  supplemental bone graft from the reamings around this and it again was stable.  I placed a 32+1 metal hip ball, reduced this in the acetabulum.  We were pleased with the leg length, offset, range of motion and stability assessed clinically and  mechanically.  In spite of known complication such as this with the fracture of the calcar,  this was secured with a FiberWire suture and I will still allow her to put full weight bear as tolerated with activity as tolerated as well.  We then irrigated  the soft tissue with normal saline solution using pulsatile lavage.  I closed the joint capsule using #1 Ethibond suture, followed by a #1 Vicryl to close the tensor fascia, 0 Vicryl was used to close the deep tissue, 2-0 Vicryl was used to close the  subcutaneous tissue and staples were used to reapproximate the skin.  Xeroform and Aquacel dressing was applied.  She was taken off the Hana table and taken to recovery room in stable condition.  All final counts were correct.  Of note Benita Stabile, PA-C,  assisted during the entire case and his assistance was crucial for facilitating all aspects of this case.  CN/NUANCE  D:09/04/2019 T:09/04/2019 JOB:[id number]

## 2019-09-04 NOTE — TOC Transition Note (Signed)
Transition of Care [facility]) - CM/SW Discharge Note   Patient Details  Name: Dominique Hawkins MRN: [id number] Date of Birth: [birth year]  [facility]) CM/SW Contact:  Lennart Pall, LCSW Phone Number: 09/04/2019, 2:12 PM   Clinical Narrative:  Met briefly with pt and reviewed HHPT follow up arrangements - [facility] to see.  Pt does need DME - referral placed with [employer] (see below).  No futher TOC needs.     Final next level of care: [place] Barriers to Discharge: Continued Medical Work up   Patient Goals and CMS Choice Patient states their goals for this hospitalization and ongoing recovery are:: go home      Discharge Placement                       Discharge Plan and Services                DME Arranged: 3-N-1, Walker rolling DME Agency: [employer] Date DME Agency Contacted: 09/04/19 Time DME Agency Contacted: [phone] Representative spoke with at DME Agency: [place]: PT (pre-arranged by ortho office) [place]: [facility] ([facility])        Social Determinants of Health ([place]) Interventions     Readmission Risk Interventions No flowsheet data found.

## 2019-09-05 DIAGNOSIS — M1611 Unilateral primary osteoarthritis, right hip: Secondary | ICD-10-CM | POA: Diagnosis not present

## 2019-09-05 LAB — BASIC METABOLIC PANEL
Anion gap: 8 (ref 5–15)
BUN: 15 mg/dL (ref 8–23)
CO2: 24 mmol/L (ref 22–32)
Calcium: 8.1 mg/dL — ABNORMAL LOW (ref 8.9–10.3)
Chloride: 106 mmol/L (ref 98–111)
Creatinine, Ser: 0.57 mg/dL (ref 0.44–1.00)
GFR calc Af Amer: >60 mL/min (ref >60)
GFR calc non Af Amer: >60 mL/min (ref >60)
Glucose, Bld: 144 mg/dL — ABNORMAL HIGH (ref 70–99)
Potassium: 4.3 mmol/L (ref 3.5–5.1)
Sodium: 138 mmol/L (ref 135–145)

## 2019-09-05 LAB — CBC
HCT: 29 % — ABNORMAL LOW (ref 36.0–46.0)
Hemoglobin: 10.1 g/dL — ABNORMAL LOW (ref 12.0–15.0)
MCH: 29.1 pg (ref 26.0–34.0)
MCHC: 34.8 g/dL (ref 30.0–36.0)
MCV: 83.6 fL (ref 80.0–100.0)
Platelets: 159 10*3/uL (ref 150–400)
RBC: 3.47 MIL/uL — ABNORMAL LOW (ref 3.87–5.11)
RDW: 13.3 % (ref 11.5–15.5)
WBC: 9.2 10*3/uL (ref 4.0–10.5)
nRBC: 0 % (ref 0.0–0.2)

## 2019-09-05 MED ORDER — OXYCODONE HCL 5 MG PO TABS: 5.0000 mg | ORAL_TABLET | ORAL | 0 refills | Status: DC | PRN

## 2019-09-05 MED ORDER — ONDANSETRON 4 MG PO TBDP
4.0000 mg | ORAL_TABLET | Freq: Three times a day (TID) | ORAL | 0 refills | Status: DC | PRN
Start: 2019-09-05 — End: 2019-09-14

## 2019-09-05 MED ORDER — METHOCARBAMOL 500 MG PO TABS: 500.0000 mg | ORAL_TABLET | Freq: Four times a day (QID) | ORAL | 1 refills | Status: DC | PRN

## 2019-09-05 MED ORDER — ASPIRIN 81 MG PO CHEW: 81.0000 mg | CHEWABLE_TABLET | Freq: Two times a day (BID) | ORAL | 0 refills | Status: DC

## 2019-09-05 NOTE — TOC Progression Note (Signed)
Transition of Care [facility]) - Progression Note    Patient Details  Name: JEANETTA ALONZO MRN: [id number] Date of Birth: Apr 25, 1940  Transition of Care [facility]) CM/SW Contact  Joaquin Courts, RN Phone Number: 09/05/2019, 11:51 AM  Clinical Narrative:    CM spoke with patient at bedside and confirmed DME has been delivered.  [facility] to provide HHPT.   Expected Discharge Plan: [place] Barriers to Discharge: Continued Medical Work up  Expected Discharge Plan and Services Expected Discharge Plan: [place]   Discharge Planning Services: CM Consult Post Acute Care Choice: [place] arrangements for the past 2 months: Single Family Home Expected Discharge Date: 09/05/19               DME Arranged: Berta Minor rolling DME Agency: [employer] Date DME Agency Contacted: 09/04/19 Time DME Agency Contacted: [phone] Representative spoke with at DME Agency: [place]: PT (pre-arranged by ortho office) [place]: [facility] (now [facility])         Social Determinants of Health (SDOH) Interventions    Readmission Risk Interventions No flowsheet data found.

## 2019-09-05 NOTE — Progress Notes (Signed)
Pt stable at time of d/c instructions and education. No needs at time of d/c.  

## 2019-09-05 NOTE — Plan of Care (Signed)
Pt stable at this time. Pt to d/c home today with family. Pt dressing dry and intact.

## 2019-09-05 NOTE — Progress Notes (Signed)
Patient vomited approximately 300cc of undigested food at [place]. Patient received Zofran at 1743. Will continue to monitor for any further vomiting episodes.

## 2019-09-05 NOTE — Discharge Summary (Signed)
Patient ID: Dominique Hawkins MRN: [id number] DOB/AGE: 11/08/1940 79 y.o.  Admit date: 09/04/2019 Discharge date: 09/05/2019  Admission Diagnoses:  Principal Problem:   Unilateral primary osteoarthritis, right hip Active Problems:   Status post total replacement of right hip   Discharge Diagnoses:  Same  Past Medical History:  Diagnosis Date   Arthritis    Cancer ([place])    skin cancer on both legs   Complication of anesthesia    Difficulty sleeping    DUE TO PAIN   Fibromyalgia    GERD (gastroesophageal reflux disease)    Hyperlipidemia    IBS (irritable bowel syndrome)    Osteoporosis    PONV (postoperative nausea and vomiting)    Prediabetes    Venous hypertension    varicose veins    Surgeries: Procedure(s): RIGHT TOTAL HIP ARTHROPLASTY ANTERIOR APPROACH on 09/04/2019   Consultants:   Discharged Condition: Improved  Hospital Course: Dominique Hawkins is an 79 y.o. female who was admitted 09/04/2019 for operative treatment ofUnilateral primary osteoarthritis, right hip. Patient has severe unremitting pain that affects sleep, daily activities, and work/hobbies. After pre-op clearance the patient was taken to the operating room on 09/04/2019 and underwent  Procedure(s): RIGHT TOTAL HIP ARTHROPLASTY ANTERIOR APPROACH.    Patient was given perioperative antibiotics:  Anti-infectives (From admission, onward)   Start     Dose/Rate Route Frequency Ordered Stop   09/04/19 1400  ceFAZolin (ANCEF) IVPB 1 g/50 mL premix        1 g 100 mL/hr over 30 Minutes Intravenous Every 6 hours 09/04/19 1009 09/04/19 2020   09/04/19 0615  ceFAZolin (ANCEF) IVPB 2g/100 mL premix        2 g 200 mL/hr over 30 Minutes Intravenous On call to O.R. 09/04/19 6629 09/04/19 0847       Patient was given sequential compression devices, early ambulation, and chemoprophylaxis to prevent DVT.  Patient benefited maximally from hospital stay and there were no complications.    Recent vital  signs:  Patient Vitals for the past 24 hrs:  BP Temp Temp src Pulse Resp SpO2  09/05/19 0544 (!) 110/59 97.9 F (36.6 C) Oral 78 -- 91 %  09/05/19 0216 (!) 112/54 97.7 F (36.5 C) Oral 73 -- 91 %  09/04/19 2153 (!) 101/58 97.9 F (36.6 C) Oral 69 17 93 %  09/04/19 1832 (!) 102/55 98 F (36.7 C) Oral 76 16 96 %  09/04/19 1515 112/67 -- -- 79 -- --  09/04/19 1505 116/64 -- -- 76 -- --  09/04/19 1427 121/66 97.6 F (36.4 C) Oral 75 15 96 %  09/04/19 1326 (!) 114/58 98 F (36.7 C) Oral 71 15 95 %  09/04/19 1233 137/66 98.2 F (36.8 C) Oral 67 16 100 %  09/04/19 1127 134/68 97.7 F (36.5 C) Oral (!) 56 14 100 %  09/04/19 1115 132/66 -- -- (!) 55 17 98 %  09/04/19 1100 126/63 (!) 97.4 F (36.3 C) -- (!) 55 14 100 %  09/04/19 1045 130/67 -- -- (!) 52 13 100 %  09/04/19 1030 129/61 -- -- (!) 58 12 100 %  09/04/19 1015 114/63 -- -- 64 14 100 %  09/04/19 1000 (!) 104/51 97.6 F (36.4 C) -- 82 14 99 %     Recent laboratory studies:  Recent Labs    09/05/19 0235  WBC 9.2  HGB 10.1*  HCT 29.0*  PLT 159  NA 138  K 4.3  CL 106  CO2 24  BUN 15  CREATININE 0.57  GLUCOSE 144*  CALCIUM 8.1*     Discharge Medications:   Allergies as of 09/05/2019      Reactions   Codeine Itching, Nausea And Vomiting, Other (See Comments)   "Can't get up"   Tramadol Other (See Comments)   "Looney"   Statins Other (See Comments)   Leg cramps   Hydrocodone-acetaminophen Nausea Only   Tylenol [acetaminophen] Other (See Comments)   Elevated Liver Enzymes      Medication List    TAKE these medications   aspirin 81 MG chewable tablet Chew 1 tablet (81 mg total) by mouth 2 (two) times daily.   cholecalciferol 25 MCG (1000 UNIT) tablet Commonly known as: VITAMIN D3 Take 2,000 Units by mouth daily.   colesevelam 625 MG tablet Commonly known as: WELCHOL Take 1,875 mg by mouth daily.   loperamide 2 MG capsule Commonly known as: IMODIUM Take 2 mg by mouth daily.   methocarbamol 500 MG  tablet Commonly known as: ROBAXIN Take 1 tablet (500 mg total) by mouth every 6 (six) hours as needed for muscle spasms.   MULTIVITAMIN GUMMIES WOMENS PO Take 2 tablets by mouth daily.   naproxen sodium 220 MG tablet Commonly known as: ALEVE Take 220 mg by mouth daily as needed (pain).   ondansetron 4 MG disintegrating tablet Commonly known as: Zofran ODT Take 1 tablet (4 mg total) by mouth every 8 (eight) hours as needed for nausea or vomiting.   oxyCODONE 5 MG immediate release tablet Commonly known as: Oxy IR/ROXICODONE Take 1-2 tablets (5-10 mg total) by mouth every 4 (four) hours as needed for moderate pain (pain score 4-6).   rosuvastatin 5 MG tablet Commonly known as: CRESTOR Take 2.5 mg by mouth daily.   TURMERIC PO Take 2 tablets by mouth daily.            Durable Medical Equipment  (From admission, onward)         Start     Ordered   09/04/19 1125  DME 3 n 1  Once        09/04/19 1124   09/04/19 1125  DME Walker rolling  Once       Question Answer Comment  Walker: With 5 Inch Wheels   Patient needs a walker to treat with the following condition Status post total replacement of right hip      09/04/19 1124          Diagnostic Studies: DG Pelvis Portable  Result Date: 09/04/2019 CLINICAL DATA:  Right hip replacement. EXAM: PORTABLE PELVIS 1-2 VIEWS COMPARISON:  Right x-ray dated July 21, 2019. FINDINGS: The right hip demonstrates interval total arthroplasty without evidence of hardware failure or complication. Lucencies overlying the lesser trochanter, likely postoperative emphysema. There is no fracture or dislocation. The alignment is anatomic. Post-surgical changes noted in the surrounding soft tissues. Prior left total hip arthroplasty. IMPRESSION: 1. Interval right total hip arthroplasty without definite acute postoperative complication. Lucencies overlying the lesser trochanter are favored to reflect subcutaneous emphysema, less likely a fracture.  Attention on follow-up imaging is recommended. Electronically Signed   By: Titus Dubin M.D.   On: 09/04/2019 13:00   DG C-Arm 1-60 Min-No Report  Result Date: 09/04/2019 Fluoroscopy was utilized by the requesting physician.  No radiographic interpretation.   DG HIP OPERATIVE UNILAT W OR W/O PELVIS RIGHT  Result Date: 09/04/2019 CLINICAL DATA:  Right hip arthroplasty EXAM: OPERATIVE RIGHT HIP (WITH PELVIS IF PERFORMED) 2 VIEWS TECHNIQUE:  Fluoroscopic spot image(s) were submitted for interpretation post-operatively. COMPARISON:  07/21/2019 FINDINGS: 3 C-arm fluoroscopic images were obtained intraoperatively and submitted for post operative interpretation. Right total hip arthroplasty hardware appears in its expected alignment without evidence of intraoperative complication. Please see the performing provider's procedural report for further detail. IMPRESSION: As above. Electronically Signed   By: Davina Poke D.O.   On: 09/04/2019 12:43    Disposition: Discharge disposition: 01-Home or [place], [facility].   Specialty: [place] Why: to provide home health physical therapy Contact information: [address] [place]  [address] [phone]        Mcarthur Rossetti, MD Follow up in 2 week(s).   Specialty: Orthopedic Surgery Contact information: [address] [place] [address] [phone]                Signed: Mcarthur Rossetti 09/05/2019, 7:35 AM

## 2019-09-05 NOTE — Progress Notes (Signed)
Physical Therapy Treatment Patient Details Name: Dominique Hawkins MRN: [id number] DOB: 1940/08/12 Today's Date: 09/05/2019    History of Present Illness Pt is 79 yo female with PMH including arthritis, fibromyalgia, GERD, hyperlipidemia, IBS, osteoporosis, post operative N/V, L THA, and prediabetes.  Pt admitted for R hip OA and is s/p R anterior THA on 09/04/19.  Pt with operative complication of fracture of lesser trochanter repaired with FiberWire suture.  Pt still with WBAT status.    PT Comments    Pt continues to be limited by pain and nausea.  Would benefit from more therapy prior to return home.  Cont POC.   Follow Up Recommendations  Follow surgeon's recommendation for DC plan and follow-up therapies;Supervision/Assistance - 24 hour     Equipment Recommendations  3in1 (PT);Rolling walker with 5" wheels    Recommendations for Other Services       Precautions / Restrictions Precautions Precautions: Fall Restrictions Weight Bearing Restrictions: Yes RLE Weight Bearing: Weight bearing as tolerated    Mobility  Bed Mobility Overal bed mobility: Needs Assistance Bed Mobility: Supine to Sit     Supine to sit: Min assist;HOB elevated     General bed mobility comments: Use of bed rails, cues for sequencing, assist for R LE and to elevate trunk  Transfers Overall transfer level: Needs assistance Equipment used: Rolling walker (2 wheeled) Transfers: Sit to/from Stand Sit to Stand: Min guard         General transfer comment: cues for safe hand placement and to come up on L LE and gradually increase weight on R  Ambulation/Gait Ambulation/Gait assistance: Min guard Gait Distance (Feet): 20 Feet Assistive device: Rolling walker (2 wheeled) Gait Pattern/deviations: Step-to pattern;Decreased stride length;Decreased stance time - right;Decreased weight shift to right Gait velocity: decreased   General Gait Details: Cues for sequence and RW use; limited distance due to  pain and nausea   Stairs             Wheelchair Mobility    Modified Rankin (Stroke Patients Only)       Balance Overall balance assessment: Needs assistance Sitting-balance support: No upper extremity supported;Feet supported Sitting balance-Leahy Scale: Good     Standing balance support: Single extremity supported;During functional activity Standing balance-Leahy Scale: Fair                              Cognition Arousal/Alertness: Awake/alert Behavior During Therapy: WFL for tasks assessed/performed Overall Cognitive Status: Within Functional Limits for tasks assessed                                        Exercises Total Joint Exercises Ankle Circles/Pumps: AROM;10 reps;Both;Seated Quad Sets: AROM;Both;5 reps;Seated Towel Squeeze: AROM;Both;10 reps;Supine Heel Slides: AAROM;Right;10 reps;Supine Hip ABduction/ADduction: AAROM;10 reps;Right;Supine Long Arc Quad: Right;10 reps;AAROM;Supine    General Comments General comments (skin integrity, edema, etc.): Pt had c/o nausea and pain but no dizziness today.  BP was stable with transfers.  Spouse was presented and educated on how to assist with transfers and exercises.      Pertinent Vitals/Pain Pain Assessment: 0-10 Pain Score: 5  Pain Location: R hip Pain Descriptors / Indicators: Sharp;Burning Pain Intervention(s): Limited activity within patient's tolerance;Monitored during session;Premedicated before session;Ice applied    Home Living  Prior Function            PT Goals (current goals can now be found in the care plan section) Acute Rehab PT Goals Patient Stated Goal: return home PT Goal Formulation: With patient/family Time For Goal Achievement: 09/18/19 Potential to Achieve Goals: Good Progress towards PT goals: Progressing toward goals    Frequency    7X/week      PT Plan Current plan remains appropriate    Co-evaluation               AM-PAC PT "6 Clicks" Mobility   Outcome Measure  Help needed turning from your back to your side while in a flat bed without using bedrails?: A Little Help needed moving from lying on your back to sitting on the side of a flat bed without using bedrails?: A Little Help needed moving to and from a bed to a chair (including a wheelchair)?: A Little Help needed standing up from a chair using your arms (e.g., wheelchair or bedside chair)?: A Little Help needed to walk in hospital room?: A Little Help needed climbing 3-5 steps with a railing? : A Lot 6 Click Score: 17    End of Session Equipment Utilized During Treatment: Gait belt Activity Tolerance: Other (comment) (limioted by pain and nausea) Patient left: with chair alarm set;in chair;with call bell/phone within reach;with family/visitor present Nurse Communication: Mobility status;Other (comment) (nausea and pain) PT Visit Diagnosis: Unsteadiness on feet (R26.81);Muscle weakness (generalized) (M62.81);Pain Pain - Right/Left: Right Pain - part of body: Hip     Time: 1034-1100 PT Time Calculation (min) (ACUTE ONLY): 26 min  Charges:  $Gait Training: 8-22 mins $Therapeutic Exercise: 8-22 mins                     Abran Richard, PT Acute Rehab Services Pager [phone] [facility] [place] 09/05/2019, 11:13 AM

## 2019-09-05 NOTE — Progress Notes (Signed)
Physical Therapy Treatment Patient Details Name: Dominique Hawkins MRN: [id number] DOB: 08-22-1940 Today's Date: 09/05/2019    History of Present Illness Pt is 79 yo female with PMH including arthritis, fibromyalgia, GERD, hyperlipidemia, IBS, osteoporosis, post operative N/V, L THA, and prediabetes.  Pt admitted for R hip OA and is s/p R anterior THA on 09/04/19.  Pt with operative complication of fracture of lesser trochanter repaired with FiberWire suture.  Pt still with WBAT status.    PT Comments    Pt demonstrating good progress this afternoon.  Improved nausea and pain control, allowing her to ambulate household distances with supervision.  Session focused on mobility and education.  Did cover HEP but did not perform exercises this session to allow for decreased pain and improved ambulation. Pt has good support and now has DME.  Recommend home with HHPT.  If in hospital, cont POC to advance mobility.    Follow Up Recommendations  Follow surgeon's recommendation for DC plan and follow-up therapies;Supervision/Assistance - 24 hour;Home health PT     Equipment Recommendations  3in1 (PT);Rolling walker with 5" wheels    Recommendations for Other Services       Precautions / Restrictions Precautions Precautions: Fall Restrictions RLE Weight Bearing: Weight bearing as tolerated    Mobility  Bed Mobility Overal bed mobility: Needs Assistance Bed Mobility: Supine to Sit     Supine to sit: Min assist;HOB elevated     General bed mobility comments: min A for R LE for pain control  Transfers Overall transfer level: Needs assistance Equipment used: Rolling walker (2 wheeled) Transfers: Sit to/from Stand Sit to Stand: Supervision         General transfer comment: Cues for safe hand placement initially; performed x 1 from bed and x 1 from [facility].  Pt able to perform toileting ADLs with supervision for sfety  Ambulation/Gait Ambulation/Gait assistance: Min guard;Supervision Gait  Distance (Feet): 150 Feet Assistive device: Rolling walker (2 wheeled) Gait Pattern/deviations: Step-to pattern;Decreased stride length;Decreased stance time - right;Decreased weight shift to right Gait velocity: decreased   General Gait Details: Cues for RW proximity; progressed from min guard to supervision; pt preferred (decreased pain) to step with L LE first b/c of pain when advancing R LE.  Discussed could progress to step through as pain allows   Stairs Stairs:  (no steps at home)           Wheelchair Mobility    Modified Rankin (Stroke Patients Only)       Balance Overall balance assessment: Needs assistance Sitting-balance support: No upper extremity supported;Feet supported Sitting balance-Leahy Scale: Good     Standing balance support: No upper extremity supported;During functional activity Standing balance-Leahy Scale: Good Standing balance comment: Pt able to do ADLs without LOB (pull up shorts and wash hands)                            Cognition Arousal/Alertness: Awake/alert Behavior During Therapy: WFL for tasks assessed/performed Overall Cognitive Status: Within Functional Limits for tasks assessed                                        Exercises Total Joint Exercises Ankle Circles/Pumps: AROM;10 reps;Both;Seated Quad Sets: AROM;Both;5 reps;Seated Towel Squeeze: AROM;Both;10 reps;Supine Heel Slides: AAROM;Right;10 reps;Supine Hip ABduction/ADduction: AAROM;10 reps;Right;Supine Long Arc Quad: Right;10 reps;AAROM;Supine    General Comments General  comments (skin integrity, edema, etc.): VSS, improved nausea, no dizziness.  Pt educated on HEP, safe ice use, no pivots, and car transfer technique.  Covered exercises on handout but did not perform in order to not increase pain prior to ambulation.  Encouraged pt to do HEP 2-3 x day as able but main focus of walking.      Pertinent Vitals/Pain Pain Assessment: 0-10 Pain  Score: 4  Pain Location: R hip Pain Descriptors / Indicators: Sharp;Burning Pain Intervention(s): Limited activity within patient's tolerance;Monitored during session;Premedicated before session;Ice applied    Home Living                      Prior Function            PT Goals (current goals can now be found in the care plan section) Acute Rehab PT Goals Patient Stated Goal: return home PT Goal Formulation: With patient/family Time For Goal Achievement: 09/18/19 Potential to Achieve Goals: Good Progress towards PT goals: Progressing toward goals    Frequency    7X/week      PT Plan Current plan remains appropriate    Co-evaluation              AM-PAC PT "6 Clicks" Mobility   Outcome Measure  Help needed turning from your back to your side while in a flat bed without using bedrails?: A Little Help needed moving from lying on your back to sitting on the side of a flat bed without using bedrails?: A Little Help needed moving to and from a bed to a chair (including a wheelchair)?: None Help needed standing up from a chair using your arms (e.g., wheelchair or bedside chair)?: None Help needed to walk in hospital room?: None Help needed climbing 3-5 steps with a railing? : A Little 6 Click Score: 21    End of Session Equipment Utilized During Treatment: Gait belt Activity Tolerance: Patient tolerated treatment well Patient left: with call bell/phone within reach;with family/visitor present;in bed;with bed alarm set Nurse Communication: Mobility status PT Visit Diagnosis: Unsteadiness on feet (R26.81);Muscle weakness (generalized) (M62.81);Pain Pain - Right/Left: Right Pain - part of body: Hip     Time: 5537-4827 PT Time Calculation (min) (ACUTE ONLY): 30 min  Charges:  $Gait Training: 8-22 mins $Therapeutic Activity: 8-22 mins                     Abran Richard, PT Acute Rehab Services Pager [phone] [facility] 09/05/2019, 3:02 PM

## 2019-09-05 NOTE — Progress Notes (Signed)
Subjective: 1 Day Post-Op Procedure(s) (LRB): RIGHT TOTAL HIP ARTHROPLASTY ANTERIOR APPROACH (Right) Patient reports pain as moderate.    Objective: Vital signs in last 24 hours: Temp:  [97.4 F (36.3 C)-98.2 F (36.8 C)] 97.9 F (36.6 C) (06/12 0544) Pulse Rate:  [52-82] 78 (06/12 0544) Resp:  [12-17] 17 (06/11 2153) BP: (101-137)/(51-68) 110/59 (06/12 0544) SpO2:  [91 %-100 %] 91 % (06/12 0544)  Intake/Output from previous day: 06/11 0701 - 06/12 0700 In: 3207.4 [P.O.:1240; I.V.:1667.4; IV Piggyback:300] Out: 2325 [Urine:1825; Emesis/NG output:300; Blood:200] Intake/Output this shift: No intake/output data recorded.  Recent Labs    09/05/19 0235  HGB 10.1*   Recent Labs    09/05/19 0235  WBC 9.2  RBC 3.47*  HCT 29.0*  PLT 159   Recent Labs    09/05/19 0235  NA 138  K 4.3  CL 106  CO2 24  BUN 15  CREATININE 0.57  GLUCOSE 144*  CALCIUM 8.1*   No results for input(s): LABPT, INR in the last 72 hours.  Sensation intact distally Intact pulses distally Dorsiflexion/Plantar flexion intact Incision: dressing C/D/I   Assessment/Plan: 1 Day Post-Op Procedure(s) (LRB): RIGHT TOTAL HIP ARTHROPLASTY ANTERIOR APPROACH (Right) Up with therapy Discharge home with home health this afternoon.    Patient's anticipated LOS is less than 2 midnights, meeting these requirements: - Younger than 65 - Lives within 1 hour of care - Has a competent adult at home to recover with post-op recover - NO history of  - Chronic pain requiring opiods  - Diabetes  - Coronary Artery Disease  - Heart failure  - Heart attack  - Stroke  - DVT/VTE  - Cardiac arrhythmia  - Respiratory Failure/COPD  - Renal failure  - Anemia  - Advanced Liver disease       Mcarthur Rossetti 09/05/2019, 7:33 AM

## 2019-09-05 NOTE — Discharge Instructions (Signed)

## 2019-09-07 ENCOUNTER — Encounter (HOSPITAL_COMMUNITY): Payer: Self-pay | Admitting: Orthopaedic Surgery

## 2019-09-07 MED ORDER — BUPIVACAINE IN DEXTROSE 0.75-8.25 % IT SOLN
INTRATHECAL | Status: DC | PRN
  Administered 2019-09-04: 1.6 mL via INTRATHECAL

## 2019-09-07 NOTE — Addendum Note (Signed)
Addendum  created 09/07/19 1336 by Catalina Gravel, MD   Child order released for a procedure order, Clinical Note Signed, Intraprocedure Blocks edited

## 2019-09-07 NOTE — Anesthesia Procedure Notes (Signed)
Spinal  Patient location during procedure: OR Start time: 09/04/2019 8:14 AM End time: 09/04/2019 8:17 AM Staffing Performed: anesthesiologist  Anesthesiologist: Catalina Gravel, MD Preanesthetic Checklist Completed: patient identified, IV checked, risks and benefits discussed, surgical consent, monitors and equipment checked, pre-op evaluation and timeout performed Spinal Block Patient position: sitting Prep: DuraPrep and site prepped and draped Patient monitoring: continuous pulse ox and blood pressure Approach: midline Location: L3-4 Injection technique: single-shot Needle Needle type: Pencan  Needle gauge: 24 G Assessment Sensory level: T8 Additional Notes Functioning IV was confirmed and monitors were applied. Sterile prep and drape, including hand hygiene, mask and sterile gloves were used. The patient was positioned and the spine was prepped. The skin was anesthetized with lidocaine.  Free flow of clear CSF was obtained prior to injecting local anesthetic into the CSF.  The spinal needle aspirated freely following injection.  The needle was carefully withdrawn.  The patient tolerated the procedure well. Consent was obtained prior to procedure with all questions answered and concerns addressed. Risks including but not limited to bleeding, infection, nerve damage, paralysis, failed block, inadequate analgesia, allergic reaction, high spinal, itching and headache were discussed and the patient wished to proceed.   Hoy Morn, MD

## 2019-09-08 ENCOUNTER — Telehealth: Payer: Self-pay | Admitting: Orthopaedic Surgery

## 2019-09-08 NOTE — Telephone Encounter (Signed)
Patient aware per Erlinda Hong ok to take Aleve

## 2019-09-08 NOTE — Telephone Encounter (Signed)
Patient called.   She is wanting to know aleve medicine.   Call back: [phone]

## 2019-09-10 ENCOUNTER — Ambulatory Visit (INDEPENDENT_AMBULATORY_CARE_PROVIDER_SITE_OTHER): Payer: Medicare Other | Admitting: Orthopedic Surgery

## 2019-09-10 ENCOUNTER — Other Ambulatory Visit: Payer: Self-pay

## 2019-09-10 ENCOUNTER — Encounter: Payer: Self-pay | Admitting: Orthopedic Surgery

## 2019-09-10 VITALS — Ht 62.0 in | Wt 154.3 lb

## 2019-09-10 DIAGNOSIS — Z96641 Presence of right artificial hip joint: Secondary | ICD-10-CM

## 2019-09-10 NOTE — Progress Notes (Signed)
Office Visit Note   Patient: Dominique Hawkins           Date of Birth: 1940-07-08           MRN: [id number] Visit Date: 09/10/2019              Requested by: No referring provider defined for this encounter. PCP: Jeannine Boga (Inactive)  Chief Complaint  Patient presents with  . Right Hip - Routine Post Op, Wound Check    09/04/19 Right THA      HPI: Patient is a 79 year old woman who is 1 week status post right total hip arthroplasty.  Patient states she had blood on her dressing and she is concerned with possible active bleeding.  Assessment & Plan: Visit Diagnoses:  1. Status post total replacement of right hip     Plan: Patient's incision is healing nicely there is no drainage she will wash with soap and water apply her silicone dressing.  Follow-up with Dr. Ninfa Linden as scheduled.  Follow-Up Instructions: Return in about 1 week (around 09/17/2019) for Follow-up as scheduled with Dr. Ninfa Linden in 1 week.Manson Passey Exam  Patient is alert, oriented, no adenopathy, well-dressed, normal affect, normal respiratory effort. Examination the incision is well approximated there is no redness no cellulitis no drainage no signs of active bleeding or infection.  Imaging: No results found. No images are attached to the encounter.  Labs: No results found for: HGBA1C, ESRSEDRATE, CRP, LABURIC, REPTSTATUS, GRAMSTAIN, CULT, LABORGA   Lab Results  Component Value Date   ALBUMIN 3.9 01/14/2014    No results found for: MG No results found for: VD25OH  No results found for: PREALBUMIN CBC EXTENDED Latest Ref Rng & Units 09/05/2019 08/31/2019 01/20/2014  WBC 4.0 - 10.5 K/uL 9.2 4.5 9.8  RBC 3.87 - 5.11 MIL/uL 3.47(L) 4.59 3.65(L)  HGB 12.0 - 15.0 g/dL 10.1(L) 13.1 10.1(L)  HCT 36 - 46 % 29.0(L) 40.3 29.9(L)  PLT 150 - 400 K/uL 159 181 175     Body mass index is 28.22 kg/m.  Orders:  No orders of the defined types were placed in this encounter.  No orders of the defined  types were placed in this encounter.    Procedures: No procedures performed  Clinical Data: No additional findings.  ROS:  All other systems negative, except as noted in the HPI. Review of Systems  Objective: Vital Signs: Ht 5\' 2"  (1.575 m)   Wt 154 lb 5 oz (70 kg)   BMI 28.22 kg/m   Specialty Comments:  No specialty comments available.  PMFS History: Patient Active Problem List   Diagnosis Date Noted  . Status post total replacement of right hip 09/04/2019  . Unilateral primary osteoarthritis, right hip 09/03/2019  . 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 Pelvic floor dysfunction in female 07/17/2019  . Chronic diarrhea 05/27/2019  . Irritable bowel syndrome with diarrhea 05/27/2019  . Bile salt-induced diarrhea 05/27/2019  . Bloating symptom 05/27/2019  . History of cholecystectomy 05/27/2019  . History of repair of hiatal hernia 05/27/2019  . History of fundoplication 17/40/8144  . Sensorineural hearing loss (SNHL) of both ears 03/25/2019  . IFG (impaired fasting glucose) 02/04/2019  . Lumbar degenerative disc disease 07/22/2017  . Lumbar spondylosis 07/22/2017  . Epigastric pain 07/04/2017  . Age related osteoporosis 11/24/2015  . Gastroesophageal reflux disease without esophagitis 11/24/2015  . Mixed hyperlipidemia 11/24/2015  . Vitamin D deficiency 11/24/2015  . OA (osteoarthritis) of hip  01/18/2014  . Varicose veins of lower extremities with other complications 51/12/2109   Past Medical History:  Diagnosis Date  . Arthritis   . Cancer ([place])    skin cancer on both legs  . Complication of anesthesia   . Difficulty sleeping    DUE TO PAIN  . Fibromyalgia   . GERD (gastroesophageal reflux disease)   . Hyperlipidemia   . IBS (irritable bowel syndrome)   . Osteoporosis   . PONV (postoperative nausea and vomiting)   . Prediabetes   . Venous hypertension    varicose veins    Family History  Problem Relation Age of Onset  . Mental  illness Mother   . Cancer Mother   . Diabetes Father   . Cancer Father   . Diabetes Brother   . Colon cancer Neg Hx   . Esophageal cancer Neg Hx   . Inflammatory bowel disease Neg Hx   . Liver disease Neg Hx   . Pancreatic cancer Neg Hx   . Rectal cancer Neg Hx   . Stomach cancer Neg Hx     Past Surgical History:  Procedure Laterality Date  . ABDOMINAL HYSTERECTOMY    . BLADDER SUSPENSION  2008  . BREAST ENHANCEMENT SURGERY    . BTL    . CHOLECYSTECTOMY    . ENDOVENOUS ABLATION SAPHENOUS VEIN W/ LASER  02-08-2011  right greater saphenous vein    and sclerotherapy  right and left legs  . HERNIA REPAIR     hiatal  . SHOULDER ARTHROSCOPY Left   . TONSILLECTOMY    . TOTAL HIP ARTHROPLASTY Left 01/18/2014   Procedure: LEFT TOTAL HIP ARTHROPLASTY ANTERIOR APPROACH;  Surgeon: Gearlean Alf, MD;  Location: [facility];  Service: Orthopedics;  Laterality: Left;  . TOTAL HIP ARTHROPLASTY Right 09/04/2019   Procedure: RIGHT TOTAL HIP ARTHROPLASTY ANTERIOR APPROACH;  Surgeon: Mcarthur Rossetti, MD;  Location: [facility];  Service: Orthopedics;  Laterality: Right;   Social History   Occupational History  . Occupation: accounting  Tobacco Use  . Smoking status: Never Smoker  . Smokeless tobacco: Never Used  Vaping Use  . Vaping Use: Never used  Substance and Sexual Activity  . Alcohol use: Yes    Comment: OCCASIONAL  . Drug use: No  . Sexual activity: Never

## 2019-09-12 ENCOUNTER — Other Ambulatory Visit: Payer: Self-pay | Admitting: Orthopaedic Surgery

## 2019-09-14 ENCOUNTER — Inpatient Hospital Stay: Payer: Medicare Other | Admitting: Orthopaedic Surgery

## 2019-09-14 NOTE — Telephone Encounter (Signed)
Please advise 

## 2019-09-17 ENCOUNTER — Ambulatory Visit (INDEPENDENT_AMBULATORY_CARE_PROVIDER_SITE_OTHER): Payer: Medicare Other | Admitting: Orthopaedic Surgery

## 2019-09-17 ENCOUNTER — Other Ambulatory Visit: Payer: Self-pay

## 2019-09-17 DIAGNOSIS — Z96641 Presence of right artificial hip joint: Secondary | ICD-10-CM

## 2019-09-17 NOTE — Progress Notes (Signed)
Patient is now 2 weeks tomorrow status post a right total hip arthroplasty direct anterior approach.  She has been increasing her range of motion and strength.  She does feel tired but overall she is doing well.  She has been wearing compressive stockings and taking aspirin twice a day.  On exam her right hip incision looks good.  Staples been removed.  There is no significant seroma.  I had her lay down in supine position her leg lengths are equal.  We will have her go down to aspirin once a day for a week and then she can stop her aspirin.  I would like to see her back in just 3 weeks.  At that visit I would like a standing low AP pelvis and lateral of her right operative hip.  I do not want her to perform any hip abduction exercises.  She did have a fracture line through the lesser trochanter intraoperatively that was repaired with just a suture.  There was no other complicating features of the implants and I am not worried about it we just want her to go slow.  All question concerns were answered and addressed.  Again we will see her back in 3 weeks I would like x-rays of her pelvis and right hip.

## 2019-09-22 ENCOUNTER — Telehealth: Payer: Self-pay | Admitting: Orthopaedic Surgery

## 2019-09-22 NOTE — Telephone Encounter (Signed)
Patient called needing Rx refilled (Oxycodone) The number to contact patient is [phone]

## 2019-09-22 NOTE — Telephone Encounter (Signed)
Please advise 

## 2019-09-23 MED ORDER — OXYCODONE HCL 5 MG PO TABS: 5.0000 mg | ORAL_TABLET | ORAL | 0 refills | Status: DC | PRN

## 2019-10-08 ENCOUNTER — Encounter: Payer: Self-pay | Admitting: Orthopaedic Surgery

## 2019-10-08 ENCOUNTER — Ambulatory Visit (INDEPENDENT_AMBULATORY_CARE_PROVIDER_SITE_OTHER): Payer: Medicare Other | Admitting: Orthopaedic Surgery

## 2019-10-08 ENCOUNTER — Other Ambulatory Visit: Payer: Self-pay

## 2019-10-08 ENCOUNTER — Ambulatory Visit (INDEPENDENT_AMBULATORY_CARE_PROVIDER_SITE_OTHER): Payer: Medicare Other

## 2019-10-08 DIAGNOSIS — Z96641 Presence of right artificial hip joint: Secondary | ICD-10-CM

## 2019-10-08 NOTE — Progress Notes (Signed)
The patient will be 5 weeks tomorrow status post a right total hip arthroplasty through [employer].  She is doing well.  She is getting good strength and motion she states.  There is some numbness around her incision.  She is ready to get in the pool she states.  Intraoperatively we did have a fracture line through the lesser trochanter that repaired with a suture.  I wanted to x-ray her today to make sure that looks good.  X-rays today show that the fracture area is healing nicely.  The implant is well-seated and in excellent position.  We are pleased overall.  She has good range of motion of that hip.  She is walking without an assistive device and is already driven.  She can get in a pool from my standpoint.  I would like to see her back in 3 months to see how she is doing overall.  At that visit I would still like an AP and lateral of the right hip only.  All questions and concerns were answered and addressed.

## 2019-11-16 ENCOUNTER — Encounter: Payer: Self-pay | Admitting: Physician Assistant

## 2019-11-16 ENCOUNTER — Ambulatory Visit: Payer: Medicare Other | Admitting: Orthopedic Surgery

## 2019-11-16 VITALS — Ht 62.0 in | Wt 154.0 lb

## 2019-11-16 DIAGNOSIS — M84375A Stress fracture, left foot, initial encounter for fracture: Secondary | ICD-10-CM | POA: Diagnosis not present

## 2019-11-16 NOTE — Progress Notes (Signed)
Office Visit Note   Patient: Dominique Hawkins           Date of Birth: May 28, 1940           MRN: [id number] Visit Date: 11/16/2019              Requested by: No referring provider defined for this encounter. PCP: Jeannine Boga (Inactive)  Chief Complaint  Patient presents with  . Left Foot - Pain      HPI: Patient is a 79 year old woman who states she initially sustained her foot injury in October she states that it is still painful.  She states that she has increased her vitamin D3 and she just started taking 6000 international units a day.  Assessment & Plan: Visit Diagnoses:  1. Metatarsal stress fracture, left, initial encounter     Plan: We will place her in a short fracture boot to be worn for ambulation.  She will continue with her vitamin D3 supplements.  Three-view radiographs of the left foot at follow-up.  Follow-Up Instructions: Return in about 4 weeks (around 12/14/2019).   Ortho Exam  Patient is alert, oriented, no adenopathy, well-dressed, normal affect, normal respiratory effort. Examination patient has a strong dorsalis pedis pulse there is no ecchymosis and bruising in the left foot she has pain reproduced with attempted distraction of the second metatarsal.  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  Imaging: No results found. No images are attached to the encounter.  Labs: No results found for: HGBA1C, ESRSEDRATE, CRP, LABURIC, REPTSTATUS, GRAMSTAIN, CULT, LABORGA   Lab Results  Component Value Date   ALBUMIN 3.9 01/14/2014    No results found for: MG No results found for: VD25OH  No results found for: PREALBUMIN CBC EXTENDED Latest Ref Rng & Units 09/05/2019 08/31/2019 01/20/2014  WBC 4.0 - 10.5 K/uL 9.2 4.5 9.8  RBC 3.87 - 5.11 MIL/uL 3.47(L) 4.59 3.65(L)  HGB 12.0 - 15.0 g/dL 10.1(L) 13.1 10.1(L)  HCT 36 - 46 % 29.0(L) 40.3 29.9(L)  PLT 150 - 400 K/uL 159 181 175     Body mass index is 28.17 kg/m.  Orders:    No orders of the defined types were placed in this encounter.  No orders of the defined types were placed in this encounter.    Procedures: No procedures performed  Clinical Data: No additional findings.  ROS:  All other systems negative, except as noted in the HPI. Review of Systems  Objective: Vital Signs: Ht 5\' 2"  (1.575 m)   Wt 154 lb (69.9 kg)   BMI 28.17 kg/m   Specialty Comments:  No specialty comments available.  PMFS History: Patient Active Problem List   Diagnosis Date Noted  . Status post total replacement of right hip 09/04/2019  . Unilateral primary osteoarthritis, right hip 09/03/2019  . 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 Pelvic floor dysfunction in female 07/17/2019  . Chronic diarrhea 05/27/2019  . Irritable bowel syndrome with diarrhea 05/27/2019  . Bile salt-induced diarrhea 05/27/2019  . Bloating symptom 05/27/2019  . History of cholecystectomy 05/27/2019  . History of repair of hiatal hernia 05/27/2019  . History of fundoplication 26/94/8546  . Sensorineural hearing loss (SNHL) of both ears 03/25/2019  . IFG (impaired fasting glucose) 02/04/2019  . Lumbar degenerative disc disease 07/22/2017  . Lumbar spondylosis 07/22/2017  . Epigastric pain 07/04/2017  . Age related osteoporosis 11/24/2015  . Gastroesophageal reflux disease without esophagitis 11/24/2015  . Mixed hyperlipidemia 11/24/2015  .  Vitamin D deficiency 11/24/2015  . OA (osteoarthritis) of hip 01/18/2014  . Varicose veins of lower extremities with other complications 82/08/154   Past Medical History:  Diagnosis Date  . Arthritis   . Cancer ([place])    skin cancer on both legs  . Complication of anesthesia   . Difficulty sleeping    DUE TO PAIN  . Fibromyalgia   . GERD (gastroesophageal reflux disease)   . Hyperlipidemia   . IBS (irritable bowel syndrome)   . Osteoporosis   . PONV (postoperative nausea and vomiting)   . Prediabetes   . Venous  hypertension    varicose veins    Family History  Problem Relation Age of Onset  . Mental illness Mother   . Cancer Mother   . Diabetes Father   . Cancer Father   . Diabetes Brother   . Colon cancer Neg Hx   . Esophageal cancer Neg Hx   . Inflammatory bowel disease Neg Hx   . Liver disease Neg Hx   . Pancreatic cancer Neg Hx   . Rectal cancer Neg Hx   . Stomach cancer Neg Hx     Past Surgical History:  Procedure Laterality Date  . ABDOMINAL HYSTERECTOMY    . BLADDER SUSPENSION  2008  . BREAST ENHANCEMENT SURGERY    . BTL    . CHOLECYSTECTOMY    . ENDOVENOUS ABLATION SAPHENOUS VEIN W/ LASER  02-08-2011  right greater saphenous vein    and sclerotherapy  right and left legs  . HERNIA REPAIR     hiatal  . SHOULDER ARTHROSCOPY Left   . TONSILLECTOMY    . TOTAL HIP ARTHROPLASTY Left 01/18/2014   Procedure: LEFT TOTAL HIP ARTHROPLASTY ANTERIOR APPROACH;  Surgeon: Gearlean Alf, MD;  Location: [facility];  Service: Orthopedics;  Laterality: Left;  . TOTAL HIP ARTHROPLASTY Right 09/04/2019   Procedure: RIGHT TOTAL HIP ARTHROPLASTY ANTERIOR APPROACH;  Surgeon: Mcarthur Rossetti, MD;  Location: [facility];  Service: Orthopedics;  Laterality: Right;   Social History   Occupational History  . Occupation: accounting  Tobacco Use  . Smoking status: Never Smoker  . Smokeless tobacco: Never Used  Vaping Use  . Vaping Use: Never used  Substance and Sexual Activity  . Alcohol use: Yes    Comment: OCCASIONAL  . Drug use: No  . Sexual activity: Never

## 2019-12-09 ENCOUNTER — Other Ambulatory Visit: Payer: Self-pay

## 2019-12-09 ENCOUNTER — Ambulatory Visit: Payer: Medicare Other | Attending: Gastroenterology | Admitting: Physical Therapy

## 2019-12-09 ENCOUNTER — Encounter: Payer: Self-pay | Admitting: Physical Therapy

## 2019-12-09 DIAGNOSIS — R279 Unspecified lack of coordination: Secondary | ICD-10-CM

## 2019-12-09 DIAGNOSIS — R159 Full incontinence of feces: Secondary | ICD-10-CM

## 2019-12-09 DIAGNOSIS — M6281 Muscle weakness (generalized): Secondary | ICD-10-CM | POA: Insufficient documentation

## 2019-12-09 NOTE — Patient Instructions (Addendum)
Moisturizers . They are used in the vagina to hydrate the mucous membrane that make up the vaginal canal. . Designed to keep a more normal acid balance (ph) . Once placed in the vagina, it will last between two to three days.  . Use 2-3 times per week at bedtime  . Ingredients to avoid is glycerin and fragrance, can increase chance of infection . Should not be used just before sex due to causing irritation . Most are gels administered either in a tampon-shaped applicator or as a vaginal suppository. They are non-hormonal.   Types of Moisturizers(internal use)  . Vitamin E vaginal suppositories- Whole foods, Amazon . Moist Again . Coconut oil- can break down condoms . Julva- (Do no use if on Tamoxifen) amazon . Yes moisturizer- amazon . NeuEve Silk , NeuEve Silver for menopausal or over 65 (if have severe vaginal atrophy or cancer treatments use NeuEve Silk for  1 month than move to [employer])- [employer], MapleFlower.dk . Olive and Bee intimate cream- www.oliveandbee.com.au . Mae vaginal Summit . Aloe .    Creams to use externally on the Vulva area  Albertson's (good for for cancer patients that had radiation to the area)- [place] or [employer].FlyingBasics.com.br  V-magic cream - amazon  Julva-amazon  Vital "V Wild Yam salve ( help moisturize and help with thinning vulvar area, does have [place] by Irwin Brakeman labial moisturizer ([place],   Coconut or olive oil  aloe   Things to avoid in the vaginal area . Do not use things to irritate the vulvar area . No lotions just specialized creams for the vulva area- Neogyn, V-magic, No soaps; can use Aveeno or Calendula cleanser if needed. Must be gentle . No deodorants . No douches . Good to sleep without underwear to let the vaginal area to air out . No scrubbing: spread the lips to let warm water rinse over labias and pat dry   Lay on  side, contract the anus for 5 seconds then rest 10 seconds, 10x 2 times per day  [facility] [address] [place], [place] [address] Phone # [phone] Fax [phone]

## 2019-12-09 NOTE — Therapy (Signed)
[facility] [address] [place], [place], [address] Phone: [phone]   Fax:  [phone]  Physical Therapy Evaluation  Patient Details  Name: Dominique Hawkins MRN: [id number] Date of Birth: [birth year] Referring Provider (PT): Dr. Irving Copas.    Encounter Date: 12/09/2019   PT End of Session - 12/09/19 0932    Visit Number 1    Date for PT Re-Evaluation 03/02/20    Authorization Type [employer] medicare    PT Start Time 0845    PT Stop Time 0930    PT Time Calculation (min) 45 min    Activity Tolerance Patient tolerated treatment well    Behavior During Therapy WFL for tasks assessed/performed           Past Medical History:  Diagnosis Date  . Arthritis   . Cancer ([place])    skin cancer on both legs  . Complication of anesthesia   . Difficulty sleeping    DUE TO PAIN  . Fibromyalgia   . GERD (gastroesophageal reflux disease)   . Hyperlipidemia   . IBS (irritable bowel syndrome)   . Osteoporosis   . PONV (postoperative nausea and vomiting)   . Prediabetes   . Venous hypertension    varicose veins    Past Surgical History:  Procedure Laterality Date  . ABDOMINAL HYSTERECTOMY    . BLADDER SUSPENSION  2008  . BREAST ENHANCEMENT SURGERY    . BTL    . CHOLECYSTECTOMY    . ENDOVENOUS ABLATION SAPHENOUS VEIN W/ LASER  02-08-2011  right greater saphenous vein    and sclerotherapy  right and left legs  . HERNIA REPAIR     hiatal  . SHOULDER ARTHROSCOPY Left   . TONSILLECTOMY    . TOTAL HIP ARTHROPLASTY Left 01/18/2014   Procedure: LEFT TOTAL HIP ARTHROPLASTY ANTERIOR APPROACH;  Surgeon: Gearlean Alf, MD;  Location: [facility];  Service: Orthopedics;  Laterality: Left;  . TOTAL HIP ARTHROPLASTY Right 09/04/2019   Procedure: RIGHT TOTAL HIP ARTHROPLASTY ANTERIOR APPROACH;  Surgeon: Mcarthur Rossetti, MD;  Location: [facility];  Service: Orthopedics;  Laterality: Right;    There were no vitals filed  for this visit.    Subjective Assessment - 12/09/19 0853    Subjective Patient reports for 10 years she is having diarrhea. The last test was a shock treatment. I do not want anymore tests. When she eats it goes through her and immediated diarrhea and it is very strong. Patient takes 3 Wellcore every morning. I had my gall bladder out. Sometimes will leak stool when she does not want to.    Pertinent History Right anterior THR 09/04/2019    Patient Stated Goals improve anal muscle tone    Currently in Pain? Yes    Pain Score 8     Pain Location Hip   anterior groin   Pain Orientation Right    Pain Descriptors / Indicators Burning;Sharp    Pain Type Acute pain    Pain Onset More than a month ago    Pain Frequency Constant    Aggravating Factors  burning with touching the top of the anterior thigh, laying on right side, quick movement,    Pain Relieving Factors taking weight off right leg    Multiple Pain Sites No              [facility] PT Assessment - 12/09/19 0001      Assessment   Medical Diagnosis K52.9 Chronic  diarrhea; M62.89 Pelvic floor dysfunction in female    Referring Provider (PT) Dr. Irving Copas.     Onset Date/Surgical Date --   10 years ago   Prior Therapy none      Restrictions   Weight Bearing Restrictions No      Balance Screen   Has the patient fallen in the past 6 months No    Has the patient had a decrease in activity level because of a fear of falling?  No    Is the patient reluctant to leave their home because of a fear of falling?  No      Home Ecologist residence      Prior Function   Level of Independence Independent      Cognition   Overall Cognitive Status Within Functional Limits for tasks assessed      Posture/Postural Control   Posture/Postural Control No significant limitations      ROM / Strength   AROM / PROM / Strength AROM;PROM;Strength      Strength   Right Hip Flexion 3+/5    Right Hip  External Rotation  3+/5    Right Hip ABduction 4-/5    Right Hip ADduction 4/5      Palpation   SI assessment  ASIS are equal    Palpation comment tenderness located in lower abdominal with fascial restrictions                      Objective measurements completed on examination: See above findings.     Pelvic Floor Special Questions - 12/09/19 0001    Prior Pregnancies Yes    Number of Vaginal Deliveries 2   one time she had a tear and stitches   Urinary Leakage No    Fecal incontinence Yes   able to empty rectum; 1 BM per day in AM   Falling out feeling (prolapse) No    Skin Integrity Intact   some stool was outside the anus   Pelvic Floor Internal Exam Patient confirms identification and approves PT to assess pelvic floor and treatment    Exam Type Rectal    Palpation tightness in the anterior rectal area, initially she would contract the gluteal but after VC she was able to do a circular contraction with a slight pull up    Strength weak squeeze, no lift                    PT Education - 12/09/19 1122    Education Details vaginal moisturizers and pelvic floor contraction in sidely    Person(s) Educated Patient    Methods Explanation;Demonstration;Verbal cues;Handout    Comprehension Returned demonstration;Verbalized understanding            PT Short Term Goals - 12/09/19 1136      PT SHORT TERM GOAL #1   Title independent with initial HEP    Time 4    Period Weeks    Status New    Target Date 01/06/20      PT SHORT TERM GOAL #2   Title understand bowel health to assist with diarrhea    Time 4    Period Weeks    Status New    Target Date 01/06/20      PT SHORT TERM GOAL #3   Title able to return demonstration with abdominal massage    Time 4    Period Weeks    Status  New    Target Date 01/06/20             PT Long Term Goals - 12/09/19 1137      PT LONG TERM GOAL #1   Title independent with advanced HEP    Time 12     Period Weeks    Status New    Target Date 03/02/20      PT LONG TERM GOAL #2   Title pelvic floor strength is >/= 3/5  to reduce fecal leakage >/= 75%    Time 12    Period Weeks    Status New    Target Date 03/02/20      PT LONG TERM GOAL #3   Title able to hold back diarrhea till she gets to the bathroom due to increased pelvic floor strength and control    Time 12    Period Weeks    Status New    Target Date 03/02/20      PT LONG TERM GOAL #4   Title able to contract the lower abdominals to engage the pelvic floor correctly to reduce leakage    Time 12    Period Weeks    Status New    Target Date 03/02/20                  Plan - 12/09/19 1123    Clinical Impression Statement Patient is a 79 year old female with diarrhea and fecal incontinence for the past 10 years. Patient reports she has 1 bowel movement per day that is Type 6. Patient reports she will leak stool when she does not want to. Patient reports 8/10 pain in the right hip and groin with quick movements, laying on right side. Patient has had a right anterior total hip replacement on 09/04/2019. Patient presently walks with a CAM boot on the left due to a fracture. Palpable tenderness located in the lower abdominal area. Pelvic floor strength is 2/5. She initially contracted the gluteal but after verbal and tactile cues she was able to contract the anus. She has decreased mobility of the anterior portion of the anus. Patient will benefit from skilled therapy to improve pelvic floor coordination and strength. while assisting in management of the diarrhea.    Personal Factors and Comorbidities Comorbidity 3+    Comorbidities Fibrmyalgia, IBS, Osteoporosis; Abdominal Hysterectomy, bladder suspension, hernia repair, Bil. THR    Examination-Activity Limitations Continence;Toileting    Stability/Clinical Decision Making Evolving/Moderate complexity    Clinical Decision Making Moderate    Rehab Potential Excellent    PT  Frequency 1x / week    PT Duration 12 weeks    PT Treatment/Interventions ADLs/Self Care Home Management;Biofeedback;Therapeutic activities;Therapeutic exercise;Neuromuscular re-education;Manual techniques;Patient/family education    PT Next Visit Plan pelvic floor EMG for anal contraction; lower abdominal fascial release; abdominal massage; lower abdominal contraction; bowel health    Consulted and Agree with Plan of Care Patient           Patient will benefit from skilled therapeutic intervention in order to improve the following deficits and impairments:  Decreased coordination, Increased fascial restricitons, Pain, Decreased strength, Decreased endurance  Visit Diagnosis: Muscle weakness (generalized) - Plan: PT plan of care cert/re-cert  Unspecified lack of coordination - Plan: PT plan of care cert/re-cert  Incontinence of feces, unspecified fecal incontinence type - Plan: PT plan of care cert/re-cert     Problem List Patient Active Problem List   Diagnosis Date Noted  . Status post total  replacement of right hip 09/04/2019  . Unilateral primary osteoarthritis, right hip 09/03/2019  . 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 Pelvic floor dysfunction in female 07/17/2019  . Chronic diarrhea 05/27/2019  . Irritable bowel syndrome with diarrhea 05/27/2019  . Bile salt-induced diarrhea 05/27/2019  . Bloating symptom 05/27/2019  . History of cholecystectomy 05/27/2019  . History of repair of hiatal hernia 05/27/2019  . History of fundoplication 18/56/3149  . Sensorineural hearing loss (SNHL) of both ears 03/25/2019  . IFG (impaired fasting glucose) 02/04/2019  . Lumbar degenerative disc disease 07/22/2017  . Lumbar spondylosis 07/22/2017  . Epigastric pain 07/04/2017  . Age related osteoporosis 11/24/2015  . Gastroesophageal reflux disease without esophagitis 11/24/2015  . Mixed hyperlipidemia 11/24/2015  . Vitamin D deficiency 11/24/2015  . OA  (osteoarthritis) of hip 01/18/2014  . Varicose veins of lower extremities with other complications 70/26/3785    Earlie Counts, PT 12/09/19 11:44 AM    [facility] [address] [place], [place], [address] Phone: [phone]   Fax:  [phone]  Name: DERRA SHARTZER MRN: [id number] Date of Birth: 1940/09/11

## 2019-12-14 ENCOUNTER — Ambulatory Visit: Payer: Medicare Other | Admitting: Orthopedic Surgery

## 2019-12-14 ENCOUNTER — Encounter: Payer: Self-pay | Admitting: Physician Assistant

## 2019-12-14 ENCOUNTER — Ambulatory Visit: Payer: Self-pay

## 2019-12-14 VITALS — Ht 62.0 in | Wt 154.0 lb

## 2019-12-14 DIAGNOSIS — M79672 Pain in left foot: Secondary | ICD-10-CM

## 2019-12-15 ENCOUNTER — Encounter: Payer: Self-pay | Admitting: Orthopedic Surgery

## 2019-12-15 NOTE — Progress Notes (Signed)
Office Visit Note   Patient: Dominique Hawkins           Date of Birth: Jan 27, 1941           MRN: [id number] Visit Date: 12/14/2019              Requested by: No referring provider defined for this encounter. PCP: Jeannine Boga (Inactive)  Chief Complaint  Patient presents with  . Left Foot - Follow-up    MT stress fracture f/u       HPI: Patient is a 79 year old woman who sustained a fracture through the base of the left second metatarsal.  Patient has tried conservative therapy for a year still has pain with activities of daily living.  Patient states she is not a smoker  Assessment & Plan: Visit Diagnoses:  1. Pain in left foot     Plan: Plan: We will plan for open reduction internal fixation with fusion across the base of the first metatarsal medial cuneiform internal fixation across the Lisfranc complex and internal fixation of the second metatarsal nonunion fracture.  Risks and benefits were discussed including infection persistent pain need for additional surgery neurovascular injury.  Patient states she understands wished to proceed at this time.  Follow-Up Instructions: Return if symptoms worsen or fail to improve, for Follow-up 1 week after surgery.Manson Passey Exam  Patient is alert, oriented, no adenopathy, well-dressed, normal affect, normal respiratory effort. Examination patient has a good dorsalis pedis pulse there is no swelling she is point tender to palpation over the base of the second metatarsal distraction across the Lisfranc complex is painful.  She has pain to palpation the base of the first metatarsal as well.  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  No images are attached to the encounter.  Labs: No results found for: HGBA1C, ESRSEDRATE, CRP, LABURIC, REPTSTATUS,  GRAMSTAIN, CULT, LABORGA   Lab Results  Component Value Date   ALBUMIN 3.9 01/14/2014    No results found for: MG No results found for: VD25OH  No results found for: PREALBUMIN CBC EXTENDED Latest Ref Rng & Units 09/05/2019 08/31/2019 01/20/2014  WBC 4.0 - 10.5 K/uL 9.2 4.5 9.8  RBC 3.87 - 5.11 MIL/uL 3.47(L) 4.59 3.65(L)  HGB 12.0 - 15.0 g/dL 10.1(L) 13.1 10.1(L)  HCT 36 - 46 % 29.0(L) 40.3 29.9(L)  PLT 150 - 400 K/uL 159 181 175     Body mass index is 28.17 kg/m.  Orders:  Orders Placed This Encounter  Procedures  . XR Foot 2 Views Left   No orders of the defined types were placed in this encounter.    Procedures: No procedures performed  Clinical Data: No additional findings.  ROS:  All other systems negative, except as noted in the HPI. Review of Systems  Objective: Vital Signs: Ht 5\' 2"  (1.575 m)   Wt 154 lb (69.9 kg)   BMI 28.17 kg/m   Specialty Comments:  No specialty comments available.  PMFS History: Patient Active Problem List   Diagnosis Date Noted  . Status post total replacement of right hip 09/04/2019  . Unilateral primary osteoarthritis, right hip 09/03/2019  . 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 Pelvic floor dysfunction in female 07/17/2019  . Chronic diarrhea 05/27/2019  . Irritable bowel syndrome with  diarrhea 05/27/2019  . Bile salt-induced diarrhea 05/27/2019  . Bloating symptom 05/27/2019  . History of cholecystectomy 05/27/2019  . History of repair of hiatal hernia 05/27/2019  . History of fundoplication 85/88/5027  . Sensorineural hearing loss (SNHL) of both ears 03/25/2019  . IFG (impaired fasting glucose) 02/04/2019  . Lumbar degenerative disc disease 07/22/2017  . Lumbar spondylosis 07/22/2017  . Epigastric pain 07/04/2017  . Age related osteoporosis 11/24/2015  . Gastroesophageal reflux disease without esophagitis 11/24/2015  . Mixed hyperlipidemia 11/24/2015  . Vitamin D deficiency 11/24/2015  . OA  (osteoarthritis) of hip 01/18/2014  . Varicose veins of lower extremities with other complications 74/02/8785   Past Medical History:  Diagnosis Date  . Arthritis   . Cancer ([place])    skin cancer on both legs  . Complication of anesthesia   . Difficulty sleeping    DUE TO PAIN  . Fibromyalgia   . GERD (gastroesophageal reflux disease)   . Hyperlipidemia   . IBS (irritable bowel syndrome)   . Osteoporosis   . PONV (postoperative nausea and vomiting)   . Prediabetes   . Venous hypertension    varicose veins    Family History  Problem Relation Age of Onset  . Mental illness Mother   . Cancer Mother   . Diabetes Father   . Cancer Father   . Diabetes Brother   . Colon cancer Neg Hx   . Esophageal cancer Neg Hx   . Inflammatory bowel disease Neg Hx   . Liver disease Neg Hx   . Pancreatic cancer Neg Hx   . Rectal cancer Neg Hx   . Stomach cancer Neg Hx     Past Surgical History:  Procedure Laterality Date  . ABDOMINAL HYSTERECTOMY    . BLADDER SUSPENSION  2008  . BREAST ENHANCEMENT SURGERY    . BTL    . CHOLECYSTECTOMY    . ENDOVENOUS ABLATION SAPHENOUS VEIN W/ LASER  02-08-2011  right greater saphenous vein    and sclerotherapy  right and left legs  . HERNIA REPAIR     hiatal  . SHOULDER ARTHROSCOPY Left   . TONSILLECTOMY    . TOTAL HIP ARTHROPLASTY Left 01/18/2014   Procedure: LEFT TOTAL HIP ARTHROPLASTY ANTERIOR APPROACH;  Surgeon: Gearlean Alf, MD;  Location: [facility];  Service: Orthopedics;  Laterality: Left;  . TOTAL HIP ARTHROPLASTY Right 09/04/2019   Procedure: RIGHT TOTAL HIP ARTHROPLASTY ANTERIOR APPROACH;  Surgeon: Mcarthur Rossetti, MD;  Location: [facility];  Service: Orthopedics;  Laterality: Right;   Social History   Occupational History  . Occupation: accounting  Tobacco Use  . Smoking status: Never Smoker  . Smokeless tobacco: Never Used  Vaping Use  . Vaping Use: Never used  Substance and Sexual Activity  . Alcohol use: Yes    Comment:  OCCASIONAL  . Drug use: No  . Sexual activity: Never

## 2019-12-16 ENCOUNTER — Ambulatory Visit (INDEPENDENT_AMBULATORY_CARE_PROVIDER_SITE_OTHER): Payer: Medicare Other | Admitting: Physician Assistant

## 2019-12-16 ENCOUNTER — Encounter: Payer: Self-pay | Admitting: Physician Assistant

## 2019-12-16 ENCOUNTER — Encounter: Payer: Self-pay | Admitting: Physical Therapy

## 2019-12-16 ENCOUNTER — Ambulatory Visit: Payer: Self-pay

## 2019-12-16 ENCOUNTER — Other Ambulatory Visit: Payer: Self-pay

## 2019-12-16 ENCOUNTER — Ambulatory Visit: Payer: Medicare Other | Admitting: Physical Therapy

## 2019-12-16 DIAGNOSIS — R159 Full incontinence of feces: Secondary | ICD-10-CM

## 2019-12-16 DIAGNOSIS — M6281 Muscle weakness (generalized): Secondary | ICD-10-CM

## 2019-12-16 DIAGNOSIS — Z96641 Presence of right artificial hip joint: Secondary | ICD-10-CM

## 2019-12-16 DIAGNOSIS — R279 Unspecified lack of coordination: Secondary | ICD-10-CM

## 2019-12-16 NOTE — Patient Instructions (Signed)
Access Code: [email or web address] URL: [email or web address] Date: 12/16/2019 Prepared by: Earlie Counts  Exercises Supine Transversus Abdominis Bracing - Hands on Stomach - 1 x daily - 7 x weekly - 1 sets - 10 reps Supine Hip Adduction Isometric with Ball - 2 x daily - 7 x weekly - 1 sets - 10 reps - 5 sec hold [facility] [address] [place], [place] [address] Phone # [phone] Fax [phone]

## 2019-12-16 NOTE — Progress Notes (Signed)
Office Visit Note   Patient: Dominique Hawkins           Date of Birth: January 08, 1941           MRN: [id number] Visit Date: 12/16/2019              Requested by: No referring provider defined for this encounter. PCP: Jeannine Boga (Inactive)   Assessment & Plan: Visit Diagnoses:  1. Status post total replacement of right hip     Plan: Discussed with her that her radiographs of her hip show the lesser trochanter to be healing well.  There is no abnormalities of the right hip.  On clinical exam she has excellent range of motion of the right hip without pain.  She does ambulate with a slight antalgic gait and I do feel that this could be causing the radicular symptoms down her right leg.  Recommend that she use knee scooter until time of surgery to offload her left foot and hopefully this will help with the radicular symptoms down the right leg.  We will see her back in 3 months for 26-month follow-up.  Questions were encouraged and answered at length.  She is given a prescription for a knee scooter. Also talked to her about wearing a shoe with a little bit of the lifter build up in it so that her right leg is as long as the left leg. Follow-Up Instructions: Return in about 3 months (around 03/16/2020).   Orders:  Orders Placed This Encounter  Procedures  . XR HIP UNILAT W OR W/O PELVIS 2-3 VIEWS RIGHT   No orders of the defined types were placed in this encounter.     Procedures: No procedures performed   Clinical Data: No additional findings.   Subjective: Chief Complaint  Patient presents with  . Right Hip - Follow-up    HPI Mrs. Dominique Hawkins is well-known but not in service returns now 3 months status post right total hip arthroplasty.  She is having some hip pain which is gotten worse since she has gone to a cam walker boot on her right foot due to a stress fracture in the second metatarsal.  She is scheduled for left foot surgery in the near future with Dr. Sharol Given.  She states  she is now having pain that is radiating down her right leg into her right foot.  She denies any back pain.  No known injury. Review of Systems See HPI.  Objective: Vital Signs: There were no vitals taken for this visit.  Physical Exam General: Well-developed well-nourished female no acute distress.  Mood and affect appropriate. Ortho Exam Bilateral hips excellent range of motion without pain.  She has negative straight leg raise bilaterally.  Nontender with palpation of the lumbar spine and paraspinous region on the right and left of the lumbar spine.  Walks with a slight antalgic gait without any assistive device. Specialty Comments:  No specialty comments available.  Imaging: XR HIP UNILAT W OR W/O PELVIS 2-3 VIEWS RIGHT  Result Date: 12/16/2019 AP pelvis lateral view of the right hip: Shows bilateral hips to be well located.  She is status post bilateral hip total arthroplasties.  Components well-seated in the right and left hip.  No acute fractures.  The right lesser trochanteric fracture appears to be healing well with good consolidation.    PMFS History: Patient Active Problem List   Diagnosis Date Noted  . Status post total replacement of right hip 09/04/2019  . Unilateral  primary osteoarthritis, right hip 09/03/2019  . 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 Pelvic floor dysfunction in female 07/17/2019  . Chronic diarrhea 05/27/2019  . Irritable bowel syndrome with diarrhea 05/27/2019  . Bile salt-induced diarrhea 05/27/2019  . Bloating symptom 05/27/2019  . History of cholecystectomy 05/27/2019  . History of repair of hiatal hernia 05/27/2019  . History of fundoplication 26/37/8588  . Sensorineural hearing loss (SNHL) of both ears 03/25/2019  . IFG (impaired fasting glucose) 02/04/2019  . Lumbar degenerative disc disease 07/22/2017  . Lumbar spondylosis 07/22/2017  . Epigastric pain 07/04/2017  . Age related osteoporosis 11/24/2015  . Gastroesophageal  reflux disease without esophagitis 11/24/2015  . Mixed hyperlipidemia 11/24/2015  . Vitamin D deficiency 11/24/2015  . OA (osteoarthritis) of hip 01/18/2014  . Varicose veins of lower extremities with other complications 50/27/7412   Past Medical History:  Diagnosis Date  . Arthritis   . Cancer ([place])    skin cancer on both legs  . Complication of anesthesia   . Difficulty sleeping    DUE TO PAIN  . Fibromyalgia   . GERD (gastroesophageal reflux disease)   . Hyperlipidemia   . IBS (irritable bowel syndrome)   . Osteoporosis   . PONV (postoperative nausea and vomiting)   . Prediabetes   . Venous hypertension    varicose veins    Family History  Problem Relation Age of Onset  . Mental illness Mother   . Cancer Mother   . Diabetes Father   . Cancer Father   . Diabetes Brother   . Colon cancer Neg Hx   . Esophageal cancer Neg Hx   . Inflammatory bowel disease Neg Hx   . Liver disease Neg Hx   . Pancreatic cancer Neg Hx   . Rectal cancer Neg Hx   . Stomach cancer Neg Hx     Past Surgical History:  Procedure Laterality Date  . ABDOMINAL HYSTERECTOMY    . BLADDER SUSPENSION  2008  . BREAST ENHANCEMENT SURGERY    . BTL    . CHOLECYSTECTOMY    . ENDOVENOUS ABLATION SAPHENOUS VEIN W/ LASER  02-08-2011  right greater saphenous vein    and sclerotherapy  right and left legs  . HERNIA REPAIR     hiatal  . SHOULDER ARTHROSCOPY Left   . TONSILLECTOMY    . TOTAL HIP ARTHROPLASTY Left 01/18/2014   Procedure: LEFT TOTAL HIP ARTHROPLASTY ANTERIOR APPROACH;  Surgeon: Gearlean Alf, MD;  Location: [facility];  Service: Orthopedics;  Laterality: Left;  . TOTAL HIP ARTHROPLASTY Right 09/04/2019   Procedure: RIGHT TOTAL HIP ARTHROPLASTY ANTERIOR APPROACH;  Surgeon: Mcarthur Rossetti, MD;  Location: [facility];  Service: Orthopedics;  Laterality: Right;   Social History   Occupational History  . Occupation: accounting  Tobacco Use  . Smoking status: Never Smoker  . Smokeless  tobacco: Never Used  Vaping Use  . Vaping Use: Never used  Substance and Sexual Activity  . Alcohol use: Yes    Comment: OCCASIONAL  . Drug use: No  . Sexual activity: Never

## 2019-12-16 NOTE — Therapy (Signed)
[facility] [address], [place], [place], [address] Phone: [phone]   Fax:  [phone]  Physical Therapy Treatment  Patient Details  Name: Dominique Hawkins MRN: [id number] Date of Birth: 1940-05-20 Referring Provider (PT): Dr. Irving Copas.    Encounter Date: 12/16/2019   PT End of Session - 12/16/19 7169    Visit Number 2    Date for PT Re-Evaluation 03/02/20    Authorization Type UHC medicare    Authorization - Visit Number 2    Authorization - Number of Visits 10    PT Start Time 0845    PT Stop Time 0923    PT Time Calculation (min) 38 min    Activity Tolerance Patient tolerated treatment well    Behavior During Therapy [facility] for tasks assessed/performed           Past Medical History:  Diagnosis Date  . Arthritis   . Cancer ([place])    skin cancer on both legs  . Complication of anesthesia   . Difficulty sleeping    DUE TO PAIN  . Fibromyalgia   . GERD (gastroesophageal reflux disease)   . Hyperlipidemia   . IBS (irritable bowel syndrome)   . Osteoporosis   . PONV (postoperative nausea and vomiting)   . Prediabetes   . Venous hypertension    varicose veins    Past Surgical History:  Procedure Laterality Date  . ABDOMINAL HYSTERECTOMY    . BLADDER SUSPENSION  2008  . BREAST ENHANCEMENT SURGERY    . BTL    . CHOLECYSTECTOMY    . ENDOVENOUS ABLATION SAPHENOUS VEIN W/ LASER  02-08-2011  right greater saphenous vein    and sclerotherapy  right and left legs  . HERNIA REPAIR     hiatal  . SHOULDER ARTHROSCOPY Left   . TONSILLECTOMY    . TOTAL HIP ARTHROPLASTY Left 01/18/2014   Procedure: LEFT TOTAL HIP ARTHROPLASTY ANTERIOR APPROACH;  Surgeon: Gearlean Alf, MD;  Location: [facility];  Service: Orthopedics;  Laterality: Left;  . TOTAL HIP ARTHROPLASTY Right 09/04/2019   Procedure: RIGHT TOTAL HIP ARTHROPLASTY ANTERIOR APPROACH;  Surgeon: Mcarthur Rossetti, MD;  Location: [facility];   Service: Orthopedics;  Laterality: Right;    There were no vitals filed for this visit.   Subjective Assessment - 12/16/19 0850    Subjective I felt good after first visit.    Pertinent History Right anterior THR 09/04/2019    Patient Stated Goals improve anal muscle tone    Currently in Pain? Yes    Pain Score 8     Pain Location Hip   anterior groin   Pain Orientation Right    Pain Descriptors / Indicators Burning;Sharp    Pain Type Acute pain    Pain Onset More than a month ago    Pain Frequency Constant    Aggravating Factors  burning with touching the top of the anterior thigh, laying on right side, quick movement    Pain Relieving Factors taking weight off right leg    Multiple Pain Sites No                             [facility] Adult PT Treatment/Exercise - 12/16/19 0001      Lumbar Exercises: Supine   Ab Set 10 reps;5 seconds    AB Set Limitations with ball squeeze to engage the lower abdominals    Other  Supine Lumbar Exercises pelvic floor contraction with ball squeeze hold 5 sec 10x with tactile cues to islolate the anal contraction      Manual Therapy   Manual Therapy Soft tissue mobilization;Myofascial release    Soft tissue mobilization circular massage to stimulate the peristalic motion of the intestines to improve bowel movements    Myofascial Release fascial release of the respiratory and urogenitial diaphragm to release the 3 planes of fascia, relesae around the rib angle and left upper quadrant to reduce trigger points                  PT Education - 12/16/19 0924    Education Details Access Code: [email or web address]    Person(s) Educated Patient    Methods Explanation;Demonstration;Verbal cues;Handout    Comprehension Returned demonstration;Verbalized understanding            PT Short Term Goals - 12/09/19 1136      PT SHORT TERM GOAL #1   Title independent with initial HEP    Time 4    Period Weeks    Status New    Target Date  01/06/20      PT SHORT TERM GOAL #2   Title understand bowel health to assist with diarrhea    Time 4    Period Weeks    Status New    Target Date 01/06/20      PT SHORT TERM GOAL #3   Title able to return demonstration with abdominal massage    Time 4    Period Weeks    Status New    Target Date 01/06/20             PT Long Term Goals - 12/09/19 1137      PT LONG TERM GOAL #1   Title independent with advanced HEP    Time 12    Period Weeks    Status New    Target Date 03/02/20      PT LONG TERM GOAL #2   Title pelvic floor strength is >/= 3/5  to reduce fecal leakage >/= 75%    Time 12    Period Weeks    Status New    Target Date 03/02/20      PT LONG TERM GOAL #3   Title able to hold back diarrhea till she gets to the bathroom due to increased pelvic floor strength and control    Time 12    Period Weeks    Status New    Target Date 03/02/20      PT LONG TERM GOAL #4   Title able to contract the lower abdominals to engage the pelvic floor correctly to reduce leakage    Time 12    Period Weeks    Status New    Target Date 03/02/20                 Plan - 12/16/19 5621    Clinical Impression Statement Patient feels she has had less fecal leakage since the last visit. Patient had some tightness in the abdomen which was reduced after the manual work. Patient was able to engage the anus with the gentle ball squeeze better today. Patient has difficulty with bringing her right leg on the mat due to the right hip pain. Patient is seeing the surgeon about her hip today. Patient will benefit from skilled therapy to improve pelvic floor coordination and strength while assisting in management of the diarrhea.    Personal Factors  and Comorbidities Comorbidity 3+    Comorbidities Fibrmyalgia, IBS, Osteoporosis; Abdominal Hysterectomy, bladder suspension, hernia repair, Bil. THR    Examination-Activity Limitations Continence;Toileting    Stability/Clinical Decision  Making Evolving/Moderate complexity    Rehab Potential Excellent    PT Frequency 1x / week    PT Duration 12 weeks    PT Treatment/Interventions ADLs/Self Care Home Management;Biofeedback;Therapeutic activities;Therapeutic exercise;Neuromuscular re-education;Manual techniques;Patient/family education    PT Next Visit Plan pelvic floor EMG for anal contraction; lower abdominal fascial release; education on abdominal massage; bowel health    PT Home Exercise Plan Access Code: [email or web address]    Recommended Other Services Md signed initial note    Consulted and Agree with Plan of Care Patient           Patient will benefit from skilled therapeutic intervention in order to improve the following deficits and impairments:  Decreased coordination, Increased fascial restricitons, Pain, Decreased strength, Decreased endurance  Visit Diagnosis: Muscle weakness (generalized)  Unspecified lack of coordination  Incontinence of feces, unspecified fecal incontinence type     Problem List Patient Active Problem List   Diagnosis Date Noted  . Status post total replacement of right hip 09/04/2019  . Unilateral primary osteoarthritis, right hip 09/03/2019  . 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 Pelvic floor dysfunction in female 07/17/2019  . Chronic diarrhea 05/27/2019  . Irritable bowel syndrome with diarrhea 05/27/2019  . Bile salt-induced diarrhea 05/27/2019  . Bloating symptom 05/27/2019  . History of cholecystectomy 05/27/2019  . History of repair of hiatal hernia 05/27/2019  . History of fundoplication 80/05/4915  . Sensorineural hearing loss (SNHL) of both ears 03/25/2019  . IFG (impaired fasting glucose) 02/04/2019  . Lumbar degenerative disc disease 07/22/2017  . Lumbar spondylosis 07/22/2017  . Epigastric pain 07/04/2017  . Age related osteoporosis 11/24/2015  . Gastroesophageal reflux disease without esophagitis 11/24/2015  . Mixed hyperlipidemia 11/24/2015  .  Vitamin D deficiency 11/24/2015  . OA (osteoarthritis) of hip 01/18/2014  . Varicose veins of lower extremities with other complications 91/50/5697    Earlie Counts, PT 12/16/19 9:31 AM   [facility] [address], [place], [place], [address] Phone: [phone]   Fax:  [phone]  Name: BRITTNAE ASCHENBRENNER MRN: [id number] Date of Birth: 1940-08-07

## 2019-12-23 ENCOUNTER — Encounter: Payer: Medicare Other | Admitting: Physical Therapy

## 2019-12-24 ENCOUNTER — Ambulatory Visit: Payer: Medicare Other | Admitting: Physical Therapy

## 2019-12-30 ENCOUNTER — Encounter: Payer: Self-pay | Admitting: Physical Therapy

## 2019-12-30 ENCOUNTER — Other Ambulatory Visit: Payer: Self-pay

## 2019-12-30 ENCOUNTER — Ambulatory Visit: Payer: Medicare Other | Attending: Gastroenterology | Admitting: Physical Therapy

## 2019-12-30 DIAGNOSIS — R159 Full incontinence of feces: Secondary | ICD-10-CM | POA: Diagnosis present

## 2019-12-30 DIAGNOSIS — M6281 Muscle weakness (generalized): Secondary | ICD-10-CM | POA: Diagnosis present

## 2019-12-30 DIAGNOSIS — R279 Unspecified lack of coordination: Secondary | ICD-10-CM | POA: Diagnosis present

## 2019-12-30 NOTE — Therapy (Addendum)
[facility] [address], [place], [place], [address] Phone: [phone]   Fax:  [phone]  Physical Therapy Treatment  Patient Details  Name: Dominique Hawkins MRN: [id number] Date of Birth: [birth year] Referring Provider (PT): Dr. Irving Copas.    Encounter Date: 12/30/2019   PT End of Session - 12/30/19 0934    Visit Number 3    Date for PT Re-Evaluation 03/02/20    Authorization Type UHC medicare    Authorization - Visit Number 3    Authorization - Number of Visits 10    PT Start Time 0845    PT Stop Time 0928    PT Time Calculation (min) 43 min    Activity Tolerance Patient tolerated treatment well    Behavior During Therapy WFL for tasks assessed/performed           Past Medical History:  Diagnosis Date  . Arthritis   . Cancer ([place])    skin cancer on both legs  . Complication of anesthesia   . Difficulty sleeping    DUE TO PAIN  . Fibromyalgia   . GERD (gastroesophageal reflux disease)   . Hyperlipidemia   . IBS (irritable bowel syndrome)   . Osteoporosis   . PONV (postoperative nausea and vomiting)   . Prediabetes   . Venous hypertension    varicose veins    Past Surgical History:  Procedure Laterality Date  . ABDOMINAL HYSTERECTOMY    . BLADDER SUSPENSION  2008  . BREAST ENHANCEMENT SURGERY    . BTL    . CHOLECYSTECTOMY    . ENDOVENOUS ABLATION SAPHENOUS VEIN W/ LASER  02-08-2011  right greater saphenous vein    and sclerotherapy  right and left legs  . HERNIA REPAIR     hiatal  . SHOULDER ARTHROSCOPY Left   . TONSILLECTOMY    . TOTAL HIP ARTHROPLASTY Left 01/18/2014   Procedure: LEFT TOTAL HIP ARTHROPLASTY ANTERIOR APPROACH;  Surgeon: Gearlean Alf, MD;  Location: [facility];  Service: Orthopedics;  Laterality: Left;  . TOTAL HIP ARTHROPLASTY Right 09/04/2019   Procedure: RIGHT TOTAL HIP ARTHROPLASTY ANTERIOR APPROACH;  Surgeon: Mcarthur Rossetti, MD;  Location: [facility];   Service: Orthopedics;  Laterality: Right;    There were no vitals filed for this visit.   Subjective Assessment - 12/30/19 0851    Subjective I had a couple of accidents. I feel okay. some of the those exericses have help with urination so I am able to make it to the bathroom.    Pertinent History Right anterior THR 09/04/2019    Patient Stated Goals improve anal muscle tone    Currently in Pain? Yes    Pain Score 4     Pain Location Hip    Pain Orientation Right    Pain Descriptors / Indicators Burning;Sharp    Pain Type Acute pain    Pain Onset More than a month ago    Pain Frequency Constant    Aggravating Factors  burning with touching the top of the anterior thigh, laying on right side, quick movement    Pain Relieving Factors taking weight off right leg    Multiple Pain Sites No                          Pelvic Floor Special Questions - 12/30/19 0001    Biofeedback sidely resting tone 1 uv, contract to 12 uv for 1 sec, contracting  10 seconds  6 uv when hold breath and 11 uv when count out loud; when contract the lower abdominals with pelvic floor and count out loud 12 uv; supine pelvic floor and lower abdominal with alternate shoulder flexion went to 20 uv; mini marching in hookly with abdominal and pelvic floor contraction at 4-5 uv    Biofeedback sensor type Surface   anal            [facility] Adult PT Treatment/Exercise - 12/30/19 0001      Manual Therapy   Manual Therapy Myofascial release    Myofascial Release fascial release around the bladder and below the umbilicus                  PT Education - 12/30/19 0934    Education Details Access Code: [email or web address]    Person(s) Educated Patient    Methods Explanation;Demonstration;Verbal cues;Handout    Comprehension Returned demonstration;Verbalized understanding            PT Short Term Goals - 12/30/19 0939      PT SHORT TERM GOAL #1   Title independent with initial HEP    Time 4    Period  Weeks    Status Achieved      PT SHORT TERM GOAL #2   Title understand bowel health to assist with diarrhea    Time 4    Period Weeks    Status On-going      PT SHORT TERM GOAL #3   Title able to return demonstration with abdominal massage    Time 4    Period Weeks    Status Achieved             PT Long Term Goals - 12/30/19 5003      PT LONG TERM GOAL #1   Title independent with advanced HEP    Time 12    Period Weeks    Status On-going      PT LONG TERM GOAL #2   Title pelvic floor strength is >/= 3/5  to reduce fecal leakage >/= 75%    Time 12    Period Weeks    Status On-going      PT LONG TERM GOAL #3   Title able to hold back diarrhea till she gets to the bathroom due to increased pelvic floor strength and control    Time 12    Period Weeks    Status On-going      PT LONG TERM GOAL #4   Title able to contract the lower abdominals to engage the pelvic floor correctly to reduce leakage    Time 12    Period Weeks    Status On-going                 Plan - 12/30/19 0915    Clinical Impression Statement Patient reports several fecal leakage accidents since last visit. She is able to walk to the commode and not leak urine for the first time. Patient pelvic floor resting tone is 1 uv. In sidely she is able to contract to 12 uv when counting out loud, contracting the pelvic floor and abdominals compared to holding  her breath and contracting to 6 uv. Patient has learned how to contract her lower abdominals as she is contracting her pelvic floor. Patient still has fascial restrictions in the lower abdominal area. Patient will benefit from skilled therapy to improve pelvic floor coordination and strength while assisting in management of the diarrhea.  Personal Factors and Comorbidities Comorbidity 3+    Comorbidities Fibrmyalgia, IBS, Osteoporosis; Abdominal Hysterectomy, bladder suspension, hernia repair, Bil. THR    Examination-Activity Limitations  Continence;Toileting    Stability/Clinical Decision Making Evolving/Moderate complexity    Rehab Potential Excellent    PT Frequency 1x / week    PT Duration 12 weeks    PT Treatment/Interventions ADLs/Self Care Home Management;Biofeedback;Therapeutic activities;Therapeutic exercise;Neuromuscular re-education;Manual techniques;Patient/family education    PT Next Visit Plan Bowel health; pelvic floor EMG with core exercise; work on 20 second contraction    PT Home Exercise Plan Access Code: [email or web address]    Consulted and Agree with Plan of Care Patient           Patient will benefit from skilled therapeutic intervention in order to improve the following deficits and impairments:  Decreased coordination, Increased fascial restricitons, Pain, Decreased strength, Decreased endurance  Visit Diagnosis: Muscle weakness (generalized)  Unspecified lack of coordination  Incontinence of feces, unspecified fecal incontinence type     Problem List Patient Active Problem List   Diagnosis Date Noted  . Status post total replacement of right hip 09/04/2019  . Unilateral primary osteoarthritis, right hip 09/03/2019  . 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 Pelvic floor dysfunction in female 07/17/2019  . Chronic diarrhea 05/27/2019  . Irritable bowel syndrome with diarrhea 05/27/2019  . Bile salt-induced diarrhea 05/27/2019  . Bloating symptom 05/27/2019  . History of cholecystectomy 05/27/2019  . History of repair of hiatal hernia 05/27/2019  . History of fundoplication 62/22/9798  . Sensorineural hearing loss (SNHL) of both ears 03/25/2019  . IFG (impaired fasting glucose) 02/04/2019  . Lumbar degenerative disc disease 07/22/2017  . Lumbar spondylosis 07/22/2017  . Epigastric pain 07/04/2017  . Age related osteoporosis 11/24/2015  . Gastroesophageal reflux disease without esophagitis 11/24/2015  . Mixed hyperlipidemia 11/24/2015  . Vitamin D deficiency 11/24/2015  . OA  (osteoarthritis) of hip 01/18/2014  . Varicose veins of lower extremities with other complications 92/01/9416    Earlie Counts, PT 03/01/20 9:04 AM    [facility] [address] [place], [place], [address] Phone: [phone]   Fax:  [phone]  Name: COURNEY GARROD MRN: [id number] Date of Birth: 12-30-1940 PHYSICAL THERAPY DISCHARGE SUMMARY  Visits from Start of Care: 3  Current functional level related to goals / functional outcomes: See above. Patient had surgery on her left foot so she was not able to attend therapy. When her foot is healed would love for her to return to physical therapy.    Remaining deficits: See above.    Education / Equipment: HEP Plan:                                                    Patient goals were not met. Patient is being discharged due to a change in medical status.  Thank you for the referrral. Earlie Counts, PT 03/01/20 9:05 AM  ?????

## 2019-12-30 NOTE — Patient Instructions (Signed)
Access Code: [email or web address] URL: [email or web address] Date: 12/30/2019 Prepared by: Earlie Counts  Exercises Supine Transversus Abdominis Bracing - Hands on Stomach - 1 x daily - 7 x weekly - 1 sets - 10 reps Supine Hip Adduction Isometric with Ball - 2 x daily - 7 x weekly - 1 sets - 10 reps - 5 sec hold Supine Alternating Shoulder Flexion - 1 x daily - 7 x weekly - 3 sets - 10 reps Supine March - 1 x daily - 7 x weekly - 3 sets - 10 reps [facility] [address] [place], [place] [address] Phone # [phone] Fax [phone]

## 2020-01-01 ENCOUNTER — Other Ambulatory Visit: Payer: Self-pay

## 2020-01-04 ENCOUNTER — Other Ambulatory Visit: Payer: Self-pay | Admitting: Physician Assistant

## 2020-01-04 ENCOUNTER — Other Ambulatory Visit (HOSPITAL_COMMUNITY)
Admission: RE | Admit: 2020-01-04 | Discharge: 2020-01-04 | Disposition: A | Discharge status: Home / Self Care | Payer: Medicare Other | Source: Ambulatory Visit | Attending: Orthopedic Surgery | Admitting: Orthopedic Surgery

## 2020-01-04 DIAGNOSIS — Z20822 Contact with and (suspected) exposure to covid-19: Secondary | ICD-10-CM | POA: Diagnosis not present

## 2020-01-04 DIAGNOSIS — Z01812 Encounter for preprocedural laboratory examination: Secondary | ICD-10-CM | POA: Diagnosis present

## 2020-01-04 LAB — SARS CORONAVIRUS 2 (TAT 6-24 HRS): SARS Coronavirus 2: NEGATIVE

## 2020-01-05 ENCOUNTER — Other Ambulatory Visit: Payer: Self-pay

## 2020-01-05 ENCOUNTER — Encounter (HOSPITAL_COMMUNITY): Payer: Self-pay | Admitting: Orthopedic Surgery

## 2020-01-05 NOTE — Progress Notes (Addendum)
Mrs. Dominique Hawkins denies chest pain or shortness of breath. Patient tested negative for Covid 01/04/20 and has been in quarantine since that time. Mrs. Dominique Hawkins denies being pre- diabetic, I did not find a hemoglobin A1C, but I do see some elevated Glucose readings.  Dr. Ward Givens is patient's PCP, patient denies ever seeing a cardiologist.

## 2020-01-06 ENCOUNTER — Other Ambulatory Visit: Payer: Self-pay

## 2020-01-06 ENCOUNTER — Ambulatory Visit (HOSPITAL_COMMUNITY)
Admission: RE | Admit: 2020-01-06 | Discharge: 2020-01-06 | Disposition: A | Discharge status: Home / Self Care | Payer: Medicare Other | Attending: Orthopedic Surgery | Admitting: Orthopedic Surgery

## 2020-01-06 ENCOUNTER — Encounter (HOSPITAL_COMMUNITY): Payer: Self-pay | Admitting: Orthopedic Surgery

## 2020-01-06 ENCOUNTER — Ambulatory Visit (HOSPITAL_COMMUNITY): Payer: Medicare Other | Admitting: Anesthesiology

## 2020-01-06 ENCOUNTER — Encounter (HOSPITAL_COMMUNITY)
Admission: RE | Disposition: A | Discharge status: Home / Self Care | Payer: Self-pay | Source: Home / Self Care | Attending: Orthopedic Surgery

## 2020-01-06 DIAGNOSIS — M1990 Unspecified osteoarthritis, unspecified site: Secondary | ICD-10-CM | POA: Diagnosis not present

## 2020-01-06 DIAGNOSIS — K589 Irritable bowel syndrome without diarrhea: Secondary | ICD-10-CM | POA: Insufficient documentation

## 2020-01-06 DIAGNOSIS — I10 Essential (primary) hypertension: Secondary | ICD-10-CM | POA: Diagnosis not present

## 2020-01-06 DIAGNOSIS — Z809 Family history of malignant neoplasm, unspecified: Secondary | ICD-10-CM | POA: Diagnosis not present

## 2020-01-06 DIAGNOSIS — Y939 Activity, unspecified: Secondary | ICD-10-CM | POA: Diagnosis not present

## 2020-01-06 DIAGNOSIS — Z90710 Acquired absence of both cervix and uterus: Secondary | ICD-10-CM | POA: Diagnosis not present

## 2020-01-06 DIAGNOSIS — Z85828 Personal history of other malignant neoplasm of skin: Secondary | ICD-10-CM | POA: Insufficient documentation

## 2020-01-06 DIAGNOSIS — E785 Hyperlipidemia, unspecified: Secondary | ICD-10-CM | POA: Insufficient documentation

## 2020-01-06 DIAGNOSIS — Z79899 Other long term (current) drug therapy: Secondary | ICD-10-CM | POA: Insufficient documentation

## 2020-01-06 DIAGNOSIS — X58XXXA Exposure to other specified factors, initial encounter: Secondary | ICD-10-CM | POA: Diagnosis not present

## 2020-01-06 DIAGNOSIS — I739 Peripheral vascular disease, unspecified: Secondary | ICD-10-CM | POA: Insufficient documentation

## 2020-01-06 DIAGNOSIS — M25375 Other instability, left foot: Secondary | ICD-10-CM | POA: Diagnosis not present

## 2020-01-06 DIAGNOSIS — Z96643 Presence of artificial hip joint, bilateral: Secondary | ICD-10-CM | POA: Diagnosis not present

## 2020-01-06 DIAGNOSIS — I87309 Chronic venous hypertension (idiopathic) without complications of unspecified lower extremity: Secondary | ICD-10-CM | POA: Insufficient documentation

## 2020-01-06 DIAGNOSIS — Z886 Allergy status to analgesic agent status: Secondary | ICD-10-CM | POA: Diagnosis not present

## 2020-01-06 DIAGNOSIS — S92322A Displaced fracture of second metatarsal bone, left foot, initial encounter for closed fracture: Secondary | ICD-10-CM | POA: Diagnosis present

## 2020-01-06 DIAGNOSIS — K219 Gastro-esophageal reflux disease without esophagitis: Secondary | ICD-10-CM | POA: Insufficient documentation

## 2020-01-06 DIAGNOSIS — Z885 Allergy status to narcotic agent status: Secondary | ICD-10-CM | POA: Insufficient documentation

## 2020-01-06 DIAGNOSIS — Z818 Family history of other mental and behavioral disorders: Secondary | ICD-10-CM | POA: Insufficient documentation

## 2020-01-06 DIAGNOSIS — Z888 Allergy status to other drugs, medicaments and biological substances status: Secondary | ICD-10-CM | POA: Diagnosis not present

## 2020-01-06 DIAGNOSIS — M810 Age-related osteoporosis without current pathological fracture: Secondary | ICD-10-CM | POA: Diagnosis not present

## 2020-01-06 DIAGNOSIS — Z833 Family history of diabetes mellitus: Secondary | ICD-10-CM | POA: Insufficient documentation

## 2020-01-06 DIAGNOSIS — S93325S Dislocation of tarsometatarsal joint of left foot, sequela: Secondary | ICD-10-CM

## 2020-01-06 DIAGNOSIS — Z9049 Acquired absence of other specified parts of digestive tract: Secondary | ICD-10-CM | POA: Diagnosis not present

## 2020-01-06 DIAGNOSIS — Z96641 Presence of right artificial hip joint: Secondary | ICD-10-CM

## 2020-01-06 HISTORY — DX: Peripheral vascular disease, unspecified: I73.9

## 2020-01-06 HISTORY — DX: Personal history of other medical treatment: Z92.89

## 2020-01-06 HISTORY — PX: ORIF TOE FRACTURE: SHX5032

## 2020-01-06 LAB — CBC
HCT: 36.6 % (ref 36.0–46.0)
Hemoglobin: 11.2 g/dL — ABNORMAL LOW (ref 12.0–15.0)
MCH: 24.8 pg — ABNORMAL LOW (ref 26.0–34.0)
MCHC: 30.6 g/dL (ref 30.0–36.0)
MCV: 81 fL (ref 80.0–100.0)
Platelets: 179 10*3/uL (ref 150–400)
RBC: 4.52 MIL/uL (ref 3.87–5.11)
RDW: 14.9 % (ref 11.5–15.5)
WBC: 3.5 10*3/uL — ABNORMAL LOW (ref 4.0–10.5)
nRBC: 0 % (ref 0.0–0.2)

## 2020-01-06 LAB — BASIC METABOLIC PANEL
Anion gap: 9 (ref 5–15)
BUN: 10 mg/dL (ref 8–23)
CO2: 25 mmol/L (ref 22–32)
Calcium: 8.6 mg/dL — ABNORMAL LOW (ref 8.9–10.3)
Chloride: 106 mmol/L (ref 98–111)
Creatinine, Ser: 0.67 mg/dL (ref 0.44–1.00)
GFR, Estimated: >60 mL/min (ref >60)
Glucose, Bld: 112 mg/dL — ABNORMAL HIGH (ref 70–99)
Potassium: 4.3 mmol/L (ref 3.5–5.1)
Sodium: 140 mmol/L (ref 135–145)

## 2020-01-06 SURGERY — OPEN REDUCTION INTERNAL FIXATION (ORIF) METATARSAL (TOE) FRACTURE
Anesthesia: General | Site: Toe | Laterality: Left

## 2020-01-06 MED ORDER — PROPOFOL 500 MG/50ML IV EMUL
INTRAVENOUS | Status: DC | PRN
  Administered 2020-01-06: 150 ug/kg/min via INTRAVENOUS

## 2020-01-06 MED ORDER — LIDOCAINE-EPINEPHRINE (PF) 1.5 %-1:200000 IJ SOLN
INTRAMUSCULAR | Status: DC | PRN
  Administered 2020-01-06: 30 mL via PERINEURAL

## 2020-01-06 MED ORDER — PROPOFOL 10 MG/ML IV BOLUS
INTRAVENOUS | Status: AC
  Filled 2020-01-06: qty 20

## 2020-01-06 MED ORDER — ONDANSETRON HCL 4 MG/2ML IJ SOLN
INTRAMUSCULAR | Status: DC | PRN
  Administered 2020-01-06: 4 mg via INTRAVENOUS

## 2020-01-06 MED ORDER — MEPERIDINE HCL 25 MG/ML IJ SOLN: 6.2500 mg | INTRAMUSCULAR | Status: DC | PRN

## 2020-01-06 MED ORDER — ONDANSETRON HCL 4 MG/2ML IJ SOLN: 4.0000 mg | Freq: Once | INTRAMUSCULAR | Status: DC | PRN

## 2020-01-06 MED ORDER — DEXAMETHASONE SODIUM PHOSPHATE 10 MG/ML IJ SOLN
INTRAMUSCULAR | Status: DC | PRN
  Administered 2020-01-06: 4 mg via INTRAVENOUS

## 2020-01-06 MED ORDER — 0.9 % SODIUM CHLORIDE (POUR BTL) OPTIME
TOPICAL | Status: DC | PRN
  Administered 2020-01-06: 1000 mL

## 2020-01-06 MED ORDER — BUPIVACAINE-EPINEPHRINE (PF) 0.5% -1:200000 IJ SOLN
INTRAMUSCULAR | Status: DC | PRN
  Administered 2020-01-06: 30 mL via PERINEURAL

## 2020-01-06 MED ORDER — HYDROMORPHONE HCL 1 MG/ML IJ SOLN: 0.2500 mg | INTRAMUSCULAR | Status: DC | PRN

## 2020-01-06 MED ORDER — CHLORHEXIDINE GLUCONATE 0.12 % MT SOLN
15.0000 mL | Freq: Once | OROMUCOSAL | Status: AC
  Administered 2020-01-06: 15 mL via OROMUCOSAL
  Filled 2020-01-06: qty 15

## 2020-01-06 MED ORDER — MIDAZOLAM HCL 2 MG/2ML IJ SOLN
INTRAMUSCULAR | Status: AC
  Filled 2020-01-06: qty 2

## 2020-01-06 MED ORDER — FENTANYL CITRATE (PF) 250 MCG/5ML IJ SOLN
INTRAMUSCULAR | Status: DC | PRN
Start: 2020-01-06 — End: 2020-01-06
  Administered 2020-01-06 (×2): 50 ug via INTRAVENOUS

## 2020-01-06 MED ORDER — MIDAZOLAM HCL 2 MG/2ML IJ SOLN
INTRAMUSCULAR | Status: DC | PRN
  Administered 2020-01-06: 2 mg via INTRAVENOUS

## 2020-01-06 MED ORDER — DEXAMETHASONE SODIUM PHOSPHATE 10 MG/ML IJ SOLN
INTRAMUSCULAR | Status: AC
  Filled 2020-01-06: qty 1

## 2020-01-06 MED ORDER — LACTATED RINGERS IV SOLN: INTRAVENOUS | Status: DC

## 2020-01-06 MED ORDER — OXYCODONE HCL 5 MG PO TABS: 5.0000 mg | ORAL_TABLET | ORAL | 0 refills | Status: DC | PRN

## 2020-01-06 MED ORDER — FENTANYL CITRATE (PF) 250 MCG/5ML IJ SOLN
INTRAMUSCULAR | Status: AC
  Filled 2020-01-06: qty 5

## 2020-01-06 MED ORDER — PROPOFOL 1000 MG/100ML IV EMUL
INTRAVENOUS | Status: AC
  Filled 2020-01-06: qty 100

## 2020-01-06 MED ORDER — ONDANSETRON HCL 4 MG/2ML IJ SOLN
INTRAMUSCULAR | Status: AC
  Filled 2020-01-06: qty 2

## 2020-01-06 MED ORDER — ORAL CARE MOUTH RINSE: 15.0000 mL | Freq: Once | OROMUCOSAL | Status: AC

## 2020-01-06 MED ORDER — CEFAZOLIN SODIUM-DEXTROSE 2-4 GM/100ML-% IV SOLN
2.0000 g | INTRAVENOUS | Status: AC
  Administered 2020-01-06: 2 g via INTRAVENOUS
  Filled 2020-01-06: qty 100

## 2020-01-06 SURGICAL SUPPLY — 42 items
BIT DRILL CANNULTD 2.6 X 130MM (DRILL) ×1 IMPLANT
BLADE AVERAGE 25X9 (BLADE) ×2 IMPLANT
BNDG COHESIVE 4X5 TAN STRL (GAUZE/BANDAGES/DRESSINGS) ×2 IMPLANT
BNDG ESMARK 4X9 LF (GAUZE/BANDAGES/DRESSINGS) ×2 IMPLANT
BNDG GAUZE ELAST 4 BULKY (GAUZE/BANDAGES/DRESSINGS) ×2 IMPLANT
CORD BIPOLAR FORCEPS 12FT (ELECTRODE) ×2 IMPLANT
COVER SURGICAL LIGHT HANDLE (MISCELLANEOUS) ×4 IMPLANT
COVER WAND RF STERILE (DRAPES) ×2 IMPLANT
DRAPE OEC MINIVIEW 54X84 (DRAPES) ×2 IMPLANT
DRAPE U-SHAPE 47X51 STRL (DRAPES) ×2 IMPLANT
DRILL CANNULATED 2.6 X 130MM (DRILL) ×2
DRSG EMULSION OIL 3X3 NADH (GAUZE/BANDAGES/DRESSINGS) ×2 IMPLANT
DURAPREP 26ML APPLICATOR (WOUND CARE) ×2 IMPLANT
ELECT REM PT RETURN 9FT ADLT (ELECTROSURGICAL) ×2
ELECTRODE REM PT RTRN 9FT ADLT (ELECTROSURGICAL) ×1 IMPLANT
GAUZE SPONGE 4X4 12PLY STRL (GAUZE/BANDAGES/DRESSINGS) ×2 IMPLANT
GLOVE BIOGEL PI IND STRL 9 (GLOVE) ×1 IMPLANT
GLOVE BIOGEL PI INDICATOR 9 (GLOVE) ×1
GLOVE SURG ORTHO 9.0 STRL STRW (GLOVE) ×2 IMPLANT
GOWN STRL REUS W/ TWL XL LVL3 (GOWN DISPOSABLE) ×2 IMPLANT
GOWN STRL REUS W/TWL XL LVL3 (GOWN DISPOSABLE) ×4
K-WIRE SNGL END 1.2X150 (MISCELLANEOUS) ×6
KIT BASIN OR (CUSTOM PROCEDURE TRAY) ×2 IMPLANT
KIT STAPLE ARCUS 16X13 STRL (Staple) ×2 IMPLANT
KIT TURNOVER KIT B (KITS) ×2 IMPLANT
KWIRE SNGL END 1.2X150 (MISCELLANEOUS) ×3 IMPLANT
MANIFOLD NEPTUNE II (INSTRUMENTS) ×2 IMPLANT
NS IRRIG 1000ML POUR BTL (IV SOLUTION) ×2 IMPLANT
PACK ORTHO EXTREMITY (CUSTOM PROCEDURE TRAY) ×2 IMPLANT
PAD ARMBOARD 7.5X6 YLW CONV (MISCELLANEOUS) ×4 IMPLANT
SCREW CANN HDLS ST 4.0X36 (Screw) ×2 IMPLANT
SCREW CANN HDLS ST 4.0X40 (Screw) ×2 IMPLANT
SUCTION FRAZIER HANDLE 10FR (MISCELLANEOUS) ×2
SUCTION TUBE FRAZIER 10FR DISP (MISCELLANEOUS) ×1 IMPLANT
SUT ETHILON 2 0 PSLX (SUTURE) ×4 IMPLANT
SUT VIC AB 2-0 CT1 27 (SUTURE) ×2
SUT VIC AB 2-0 CT1 TAPERPNT 27 (SUTURE) ×1 IMPLANT
SYR CONTROL 10ML LL (SYRINGE) ×2 IMPLANT
TOWEL GREEN STERILE (TOWEL DISPOSABLE) ×2 IMPLANT
TOWEL GREEN STERILE FF (TOWEL DISPOSABLE) ×2 IMPLANT
TUBE CONNECTING 12X1/4 (SUCTIONS) ×2 IMPLANT
WATER STERILE IRR 1000ML POUR (IV SOLUTION) ×2 IMPLANT

## 2020-01-06 NOTE — Op Note (Signed)
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  SURGEON:  Newt Minion, MD  PHYSICIAN ASSISTANT:None ANESTHESIA:   General  PREOPERATIVE INDICATIONS:  [name] is a  79 y.o. 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    The risks benefits and alternatives were discussed with the patient preoperatively including but not limited to the risks of infection, bleeding, nerve injury, cardiopulmonary complications, the need for revision surgery, among others, and the patient was willing to proceed.  OPERATIVE IMPLANTS: Compression screws x2 staples x1  @ENCIMAGES @  OPERATIVE FINDINGS: The fracture site across the base of the second metatarsal head healed nicely.  The Lisfranc joint was unstable and the base of the first metatarsal medial cuneiform was unstable.  C-arm fluoroscopy verified reduction.  OPERATIVE PROCEDURE: Patient was brought the operating room after undergoing a regional anesthetic.  After adequate levels anesthesia were obtained patient's left lower extremity was prepped using DuraPrep draped into a sterile field a timeout was called.  A dorsal incision was made in the first webspace blunt dissection was carried down to the interval between the anterior tibial tendon and the EHL.  This was then carried sharply down to bone the base of the first metatarsal and medial cuneiform was unstable tissue was debrided from around the joint the articular cartilage was removed with an oscillating saw from the joint this was cleansed reduced K wire was inserted and a 40 mm compression screw was placed across the base  of the first metatarsal.  C-arm possibly verified alignment.  Debridement of the Lisfranc complex was then performed this joint was unstable the base of the second metatarsal fracture had healed well there was good cortical bone.  A screw was then placed from the medial cuneiform across the Lisfranc complex to stabilize the base of the second metatarsal.  A additional staple was placed to further stabilize the base of the first metatarsal medial cuneiform 20 mm stable by 16 mm length.  See arthroscopy verified alignment AP and lateral planes.  The wounds irrigated normal saline incision was closed using 2-0 nylon a sterile dressing was applied patient was taken the PACU in stable condition   DISCHARGE PLANNING:  Antibiotic duration: Preoperative antibiotics  Weightbearing: Touchdown weightbearing on the left  Pain medication: Prescription for Percocet  Dressing care/ Wound VAC: Follow-up in the office 1 week to change the dressing  Ambulatory devices: Crutches  Discharge to: Home.  Follow-up: In the office 1 week post operative.

## 2020-01-06 NOTE — Anesthesia Preprocedure Evaluation (Addendum)
Anesthesia Evaluation  Patient identified by MRN, date of birth, ID band Patient awake    Reviewed: Allergy & Precautions, NPO status , Patient's Chart, lab work & pertinent test results  History of Anesthesia Complications (+) PONV  Airway Mallampati: I  TM Distance: >3 FB Neck ROM: Full    Dental   Pulmonary    Pulmonary exam normal        Cardiovascular Normal cardiovascular exam     Neuro/Psych    GI/Hepatic GERD  Controlled and Medicated,  Endo/Other    Renal/GU      Musculoskeletal   Abdominal   Peds  Hematology   Anesthesia Other Findings   Reproductive/Obstetrics                             Anesthesia Physical Anesthesia Plan  ASA: II  Anesthesia Plan: Regional   Post-op Pain Management:    Induction:   PONV Risk Score and Plan: 4 or greater and Ondansetron, Midazolam and Dexamethasone  Airway Management Planned:   Additional Equipment:   Intra-op Plan:   Post-operative Plan:   Informed Consent: I have reviewed the patients History and Physical, chart, labs and discussed the procedure including the risks, benefits and alternatives for the proposed anesthesia with the patient or authorized representative who has indicated his/her understanding and acceptance.       Plan Discussed with: CRNA and Surgeon  Anesthesia Plan Comments:        Anesthesia Quick Evaluation

## 2020-01-06 NOTE — Anesthesia Postprocedure Evaluation (Signed)
Anesthesia Post Note  Patient: Dominique Hawkins  Procedure(s) Performed: LEFT FOOT FUSION BASE 1ST METATARSAL AND OPEN REDUCTION INTERNAL FIXATION 2ND METATARSAL (Left Toe)     Patient location during evaluation: PACU Anesthesia Type: MAC Level of consciousness: awake and alert Pain management: pain level controlled Vital Signs Assessment: post-procedure vital signs reviewed and stable Respiratory status: spontaneous breathing, nonlabored ventilation, respiratory function stable and patient connected to nasal cannula oxygen Cardiovascular status: blood pressure returned to baseline and stable Postop Assessment: no apparent nausea or vomiting Anesthetic complications: no   No complications documented.  Last Vitals:  Vitals:   01/06/20 0950 01/06/20 1005  BP: 117/62   Pulse: 71   Resp: 17   Temp:  36.5 C  SpO2: 99%     Last Pain:  Vitals:   01/06/20 1005  TempSrc:   PainSc: 0-No pain                 Kaiden Dardis DAVID

## 2020-01-06 NOTE — Anesthesia Procedure Notes (Signed)
Anesthesia Regional Block: Popliteal block   Pre-Anesthetic Checklist: ,, timeout performed, Correct Patient, Correct Site, Correct Laterality, Correct Procedure, Correct Position, site marked, Risks and benefits discussed,  Surgical consent,  Pre-op evaluation,  At surgeon's request and post-op pain management  Laterality: Left  Prep: chloraprep       Needles:  Injection technique: Single-shot  Needle Type: Echogenic Stimulator Needle     Needle Length: 10cm  Needle Gauge: 21     Additional Needles:   Procedures:, nerve stimulator,,,,,,,   Nerve Stimulator or Paresthesia:  Response: 0.4 mA,   Additional Responses:   Narrative:  Start time: 01/06/2020 8:15 AM End time: 01/06/2020 8:25 AM Injection made incrementally with aspirations every 5 mL.  Performed by: Personally  Anesthesiologist: Lillia Abed, MD  Additional Notes: Monitors applied. Patient sedated. Sterile prep and drape,hand hygiene and sterile gloves were used. Relevant anatomy identified.Needle position confirmed.Local anesthetic injected incrementally after negative aspiration. Local anesthetic spread visualized around nerve(s). Vascular puncture avoided. No complications. Image printed for medical record.The patient tolerated the procedure well.  Additional Saphenous nerve block performed. 15cc Local Anesthetic mixture placed under ultrasonic guidance along the medio-inferior border of the Sartorious muscle 6 inches above the knee.  No Problems encountered.  Lillia Abed MD

## 2020-01-06 NOTE — Anesthesia Procedure Notes (Signed)
Procedure Name: MAC Date/Time: 01/06/2020 8:32 AM Performed by: Barrington Ellison, CRNA Pre-anesthesia Checklist: Patient identified, Emergency Drugs available, Suction available, Patient being monitored and Timeout performed Patient Re-evaluated:Patient Re-evaluated prior to induction Oxygen Delivery Method: Simple face mask

## 2020-01-06 NOTE — Transfer of Care (Signed)
Immediate Anesthesia Transfer of Care Note  Patient: Dominique Hawkins  Procedure(s) Performed: LEFT FOOT FUSION BASE 1ST METATARSAL AND OPEN REDUCTION INTERNAL FIXATION 2ND METATARSAL (Left Toe)  Patient Location: PACU  Anesthesia Type:MAC and Regional  Level of Consciousness: awake, alert  and oriented  Airway & Oxygen Therapy: Patient Spontanous Breathing  Post-op Assessment: Report given to RN  Post vital signs: Reviewed and stable  Last Vitals:  Vitals Value Taken Time  BP 117/57 01/06/20 0933  Temp    Pulse 80 01/06/20 0932  Resp 16 01/06/20 0932  SpO2 96 % 01/06/20 0932  Vitals shown include unvalidated device data.  Last Pain:  Vitals:   01/06/20 0733  TempSrc:   PainSc: 0-No pain      Patients Stated Pain Goal: 3 (68/37/29 0211)  Complications: No complications documented.

## 2020-01-06 NOTE — Progress Notes (Signed)
Orthopedic Tech Progress Note Patient Details:  Dominique Hawkins [birth year] [id number]  Ortho Devices Type of Ortho Device: Crutches, CAM walker Ortho Device/Splint Location: LLE Ortho Device/Splint Interventions: Ordered, Application   Post Interventions Patient Tolerated: Ambulated well, Well Instructions Provided: Care of device   Janit Pagan 01/06/2020, 12:16 PM

## 2020-01-06 NOTE — H&P (Signed)
Dominique Hawkins is an 79 y.o. female.   Chief Complaint: Left Foot Pain HPI:  Patient is a 79 year old woman who sustained a fracture through the base of the left second metatarsal.  Patient has tried conservative therapy for a year still has pain with activities of daily living.  Patient states she is not a smoker Past Medical History:  Diagnosis Date  . Arthritis   . Cancer (Dominique Hawkins)    skin cancer on both legs  . Complication of anesthesia   . Difficulty sleeping    DUE TO PAIN  . GERD (gastroesophageal reflux disease)   . History of blood transfusion   . Hyperlipidemia   . IBS (irritable bowel syndrome)    diarrhea  . Osteoporosis   . Peripheral vascular disease (Dominique Hawkins)   . PONV (postoperative nausea and vomiting)   . Prediabetes   . Venous hypertension    varicose veins    Past Surgical History:  Procedure Laterality Date  . ABDOMINAL HYSTERECTOMY    . BLADDER SUSPENSION  2008  . BREAST ENHANCEMENT SURGERY    . BTL    . CHOLECYSTECTOMY    . COLONOSCOPY    . ENDOVENOUS ABLATION SAPHENOUS VEIN W/ LASER  02-08-2011  right greater saphenous vein    and sclerotherapy  right and left legs  . HERNIA REPAIR     hiatal  . SHOULDER ARTHROSCOPY Left   . TONSILLECTOMY    . TOTAL HIP ARTHROPLASTY Left 01/18/2014   Procedure: LEFT TOTAL HIP ARTHROPLASTY ANTERIOR APPROACH;  Surgeon: Gearlean Alf, MD;  Location: [facility];  Service: Orthopedics;  Laterality: Left;  . TOTAL HIP ARTHROPLASTY Right 09/04/2019   Procedure: RIGHT TOTAL HIP ARTHROPLASTY ANTERIOR APPROACH;  Surgeon: Mcarthur Rossetti, MD;  Location: [facility];  Service: Orthopedics;  Laterality: Right;    Family History  Problem Relation Age of Onset  . Mental illness Mother   . Cancer Mother   . Diabetes Father   . Cancer Father   . Diabetes Brother   . Colon cancer Neg Hx   . Esophageal cancer Neg Hx   . Inflammatory bowel disease Neg Hx   . Liver disease Neg Hx   . Pancreatic cancer Neg Hx   . Rectal cancer Neg Hx    . Stomach cancer Neg Hx    Social History:  reports that she has never smoked. She has never used smokeless tobacco. She reports current alcohol use. She reports that she does not use drugs.  Allergies:  Allergies  Allergen Reactions  . Codeine Itching, Nausea And Vomiting and Other (See Comments)    "Can't get up"    . Tramadol Other (See Comments)    "Looney"  . Statins Other (See Comments)    Leg cramps  . Hydrocodone-Acetaminophen Nausea Only  . Tylenol [Acetaminophen] Other (See Comments)    Elevated Liver Enzymes    Medications Prior to Admission  Medication Sig Dispense Refill  . Cholecalciferol (VITAMIN D) 50 MCG (2000 UT) CAPS Take 6,000 Units by mouth daily.     . colesevelam (WELCHOL) 625 MG tablet Take 1,875 mg by mouth daily with breakfast.     . loperamide (IMODIUM) 2 MG capsule Take 2 mg by mouth daily.     . Multiple Vitamins-Minerals (MULTIVITAMIN GUMMIES WOMENS PO) Take 2 tablets by mouth daily. Gummie    . naproxen sodium (ALEVE) 220 MG tablet Take 220 mg by mouth daily as needed (pain).    Marland Kitchen OVER THE COUNTER MEDICATION  Take 500 mg by mouth daily. CBD    . rosuvastatin (CRESTOR) 5 MG tablet Take 2.5 mg by mouth daily.     . methocarbamol (ROBAXIN) 500 MG tablet Take 1 tablet (500 mg total) by mouth every 6 (six) hours as needed for muscle spasms. (Patient not taking: Reported on 01/01/2020) 40 tablet 1  . ondansetron (ZOFRAN-ODT) 4 MG disintegrating tablet TAKE 1 TABLET BY MOUTH EVERY 8 HOURS AS NEEDED FOR NAUSEA AND VOMITING (Patient not taking: Reported on 01/01/2020) 20 tablet 0    Results for orders placed or performed during the hospital encounter of 01/04/20 (from the past 48 hour(s))  SARS CORONAVIRUS 2 (TAT 6-24 HRS) Nasopharyngeal Nasopharyngeal Swab     Status: None   Collection Time: 01/04/20  9:06 AM   Specimen: Nasopharyngeal Swab  Result Value Ref Range   SARS Coronavirus 2 NEGATIVE NEGATIVE    Comment: (NOTE) SARS-CoV-2 target nucleic acids  are NOT DETECTED.  The SARS-CoV-2 RNA is generally detectable in upper and lower respiratory specimens during the acute phase of infection. Negative results do not preclude SARS-CoV-2 infection, do not rule out co-infections with other pathogens, and should not be used as the sole basis for treatment or other patient management decisions. Negative results must be combined with clinical observations, patient history, and epidemiological information. The expected result is Negative.  Fact Sheet for Patients: SugarRoll.be  Fact Sheet for Healthcare Providers: https://www.woods-mathews.com/  This test is not yet approved or cleared by the [place] FDA and  has been authorized for detection and/or diagnosis of SARS-CoV-2 by FDA under an Emergency Use Authorization (EUA). This EUA will remain  in effect (meaning this test can be used) for the duration of the COVID-19 declaration under Se ction 564(b)(1) of the Act, 21 U.S.C. section 360bbb-3(b)(1), unless the authorization is terminated or revoked sooner.  Performed at [facility], [place] [address], [place], [place] [address]    No results found.  Review of Systems  All other systems reviewed and are negative.   Height 5\' 2"  (1.575 m), weight 68 kg. Physical Exam  Patient is alert, oriented, no adenopathy, well-dressed, normal affect, normal respiratory effort. Examination patient has a good dorsalis pedis pulse there is no swelling she is point tender to palpation over the base of the second metatarsal distraction across the Lisfranc complex is painful.  She has pain to palpation the base of the first metatarsal as well.Heart RRR Lungs Clear Assessment/Plan Plan: Plan: We will plan for open reduction internal fixation with fusion across the base of the first metatarsal medial cuneiform internal fixation across the Lisfranc complex and internal fixation of the second metatarsal  nonunion fracture.  Risks and benefits were discussed including infection persistent pain need for additional surgery neurovascular injury.  Patient states she understands wished to proceed at this time.  Latania Bascomb Jerris Keltz, PA 01/06/2020, 6:33 AM

## 2020-01-07 ENCOUNTER — Encounter: Payer: Medicare Other | Admitting: Physical Therapy

## 2020-01-07 ENCOUNTER — Ambulatory Visit: Payer: Medicare Other | Admitting: Orthopaedic Surgery

## 2020-01-07 ENCOUNTER — Encounter (HOSPITAL_COMMUNITY): Payer: Self-pay | Admitting: Orthopedic Surgery

## 2020-01-13 ENCOUNTER — Encounter: Payer: Self-pay | Admitting: Physician Assistant

## 2020-01-13 ENCOUNTER — Ambulatory Visit (INDEPENDENT_AMBULATORY_CARE_PROVIDER_SITE_OTHER): Payer: Medicare Other | Admitting: Physician Assistant

## 2020-01-13 ENCOUNTER — Other Ambulatory Visit: Payer: Self-pay | Admitting: Physician Assistant

## 2020-01-13 VITALS — Ht 62.0 in | Wt 150.0 lb

## 2020-01-13 DIAGNOSIS — M1611 Unilateral primary osteoarthritis, right hip: Secondary | ICD-10-CM

## 2020-01-13 MED ORDER — GABAPENTIN 300 MG PO CAPS: 300.0000 mg | ORAL_CAPSULE | Freq: Three times a day (TID) | ORAL | 3 refills | Status: DC

## 2020-01-13 NOTE — Progress Notes (Signed)
Office Visit Note   Patient: Dominique Hawkins           Date of Birth: 04-Jul-1940           MRN: [id number] Visit Date: 01/13/2020              Requested by: No referring provider defined for this encounter. PCP: Jeannine Boga (Inactive)  Chief Complaint  Patient presents with   Left Foot - Routine Post Op    01/06/20 left foot fusion base 1MT ORIF 2nd MT       HPI: Patient presents today 1 week status post left foot fusion.  She is doing well.  She denies fever, chills, or calf pain.  She does have some burning and is taken gabapentin in the past and is requesting and prescription for this today.  Assessment & Plan: Visit Diagnoses: No diagnosis found.  Plan: May begin daily cleansing and dressing changes.  Will call in gabapentin.  Follow-up in 1 week at which time x-rays of her foot should be taken  Follow-Up Instructions: No follow-ups on file.   Ortho Exam  Patient is alert, oriented, no adenopathy, well-dressed, normal affect, normal respiratory effort. Focused examination of her foot demonstrates well apposed wound edges.  On the proximal end she does have some bleeding but no necrosis no dehiscence.  Swelling is overall well controlled toes are warm and pink with brisk capillary refill no cellulitis or evidence of infection  Imaging: No results found. No images are attached to the encounter.  Labs: No results found for: HGBA1C, ESRSEDRATE, CRP, LABURIC, REPTSTATUS, GRAMSTAIN, CULT, LABORGA   Lab Results  Component Value Date   ALBUMIN 3.9 01/14/2014    No results found for: MG No results found for: VD25OH  No results found for: PREALBUMIN CBC EXTENDED Latest Ref Rng & Units 01/06/2020 09/05/2019 08/31/2019  WBC 4.0 - 10.5 K/uL 3.5(L) 9.2 4.5  RBC 3.87 - 5.11 MIL/uL 4.52 3.47(L) 4.59  HGB 12.0 - 15.0 g/dL 11.2(L) 10.1(L) 13.1  HCT 36 - 46 % 36.6 29.0(L) 40.3  PLT 150 - 400 K/uL 179 159 181     Body mass index is 27.44 kg/m.  Orders:  No orders of  the defined types were placed in this encounter.  No orders of the defined types were placed in this encounter.    Procedures: No procedures performed  Clinical Data: No additional findings.  ROS:  All other systems negative, except as noted in the HPI. Review of Systems  Objective: Vital Signs: Ht 5\' 2"  (1.575 m)    Wt 150 lb (68 kg)    BMI 27.44 kg/m   Specialty Comments:  No specialty comments available.  PMFS History: Patient Active Problem List   Diagnosis Date Noted   Lisfranc dislocation, left, sequela    Status post total replacement of right hip 09/04/2019   Unilateral primary osteoarthritis, right hip 09/03/2019   Fecal soiling due to fecal incontinence 07/17/2019   Pelvic floor dysfunction in female 07/17/2019   Chronic diarrhea 05/27/2019   Irritable bowel syndrome with diarrhea 05/27/2019   Bile salt-induced diarrhea 05/27/2019   Bloating symptom 05/27/2019   History of cholecystectomy 05/27/2019   History of repair of hiatal hernia 05/27/2019   History of fundoplication 17/61/6073   Sensorineural hearing loss (SNHL) of both ears 03/25/2019   IFG (impaired fasting glucose) 02/04/2019   Lumbar degenerative disc disease 07/22/2017   Lumbar spondylosis 07/22/2017   Epigastric pain 07/04/2017   Age related  osteoporosis 11/24/2015   Gastroesophageal reflux disease without esophagitis 11/24/2015   Mixed hyperlipidemia 11/24/2015   Vitamin D deficiency 11/24/2015   OA (osteoarthritis) of hip 01/18/2014   Varicose veins of lower extremities with other complications 01/65/5374   Past Medical History:  Diagnosis Date   Arthritis    Cancer ([place])    skin cancer on both legs   Complication of anesthesia    Difficulty sleeping    DUE TO PAIN   GERD (gastroesophageal reflux disease)    History of blood transfusion    Hyperlipidemia    IBS (irritable bowel syndrome)    diarrhea   Osteoporosis    Peripheral vascular disease  ([facility])    PONV (postoperative nausea and vomiting)    Prediabetes    Venous hypertension    varicose veins    Family History  Problem Relation Age of Onset   Mental illness Mother    Cancer Mother    Diabetes Father    Cancer Father    Diabetes Brother    Colon cancer Neg Hx    Esophageal cancer Neg Hx    Inflammatory bowel disease Neg Hx    Liver disease Neg Hx    Pancreatic cancer Neg Hx    Rectal cancer Neg Hx    Stomach cancer Neg Hx     Past Surgical History:  Procedure Laterality Date   ABDOMINAL HYSTERECTOMY     BLADDER SUSPENSION  2008   BREAST ENHANCEMENT SURGERY     BTL     CHOLECYSTECTOMY     COLONOSCOPY     ENDOVENOUS ABLATION SAPHENOUS VEIN W/ LASER  02-08-2011  right greater saphenous vein    and sclerotherapy  right and left legs   HERNIA REPAIR     hiatal   ORIF TOE FRACTURE Left 01/06/2020   Procedure: LEFT FOOT FUSION BASE 1ST METATARSAL AND OPEN REDUCTION INTERNAL FIXATION 2ND METATARSAL;  Surgeon: Newt Minion, MD;  Location: [facility];  Service: Orthopedics;  Laterality: Left;   SHOULDER ARTHROSCOPY Left    TONSILLECTOMY     TOTAL HIP ARTHROPLASTY Left 01/18/2014   Procedure: LEFT TOTAL HIP ARTHROPLASTY ANTERIOR APPROACH;  Surgeon: Gearlean Alf, MD;  Location: [facility];  Service: Orthopedics;  Laterality: Left;   TOTAL HIP ARTHROPLASTY Right 09/04/2019   Procedure: RIGHT TOTAL HIP ARTHROPLASTY ANTERIOR APPROACH;  Surgeon: Mcarthur Rossetti, MD;  Location: [facility];  Service: Orthopedics;  Laterality: Right;   Social History   Occupational History   Occupation: Press photographer  Tobacco Use   Smoking status: Never Smoker   Smokeless tobacco: Never Used  Scientific laboratory technician Use: Never used  Substance and Sexual Activity   Alcohol use: Yes    Comment: OCCASIONAL   Drug use: No   Sexual activity: Never

## 2020-01-20 ENCOUNTER — Ambulatory Visit: Payer: Medicare Other | Admitting: Physician Assistant

## 2020-01-22 ENCOUNTER — Ambulatory Visit (INDEPENDENT_AMBULATORY_CARE_PROVIDER_SITE_OTHER): Payer: Medicare Other

## 2020-01-22 ENCOUNTER — Encounter: Payer: Self-pay | Admitting: Physician Assistant

## 2020-01-22 ENCOUNTER — Other Ambulatory Visit: Payer: Self-pay

## 2020-01-22 ENCOUNTER — Ambulatory Visit (INDEPENDENT_AMBULATORY_CARE_PROVIDER_SITE_OTHER): Payer: Medicare Other | Admitting: Physician Assistant

## 2020-01-22 DIAGNOSIS — M79672 Pain in left foot: Secondary | ICD-10-CM

## 2020-01-22 NOTE — Progress Notes (Signed)
Office Visit Note   Patient: Dominique Hawkins           Date of Birth: 1940/08/06           MRN: [id number] Visit Date: 01/22/2020              Requested by: No referring provider defined for this encounter. PCP: Jeannine Boga (Inactive)  Chief Complaint  Patient presents with  . Left Foot - Routine Post Op      HPI: This is a pleasant 79 year old woman who is 2 weeks status post left first tarsometatarsal arthrodesis.  She has been nonweightbearing in her boot.  Here for routine follow-up  Assessment & Plan: Visit Diagnoses:  1. Pain in left foot     Plan: She had quite a bit of swelling today and the proximal end of her wound was bleeding as recently as 2 days ago.  She will continue nonweightbearing follow-up in 1 week for suture removal and slow advancement of weightbearing  Follow-Up Instructions: No follow-ups on file.   Ortho Exam  Patient is alert, oriented, no adenopathy, well-dressed, normal affect, normal respiratory effort. Focused examination of her foot demonstrates overall well-healed surgical incision still healing at the proximal end of the incision mild to moderate soft tissue swelling but no erythema no cellulitis.  Compartments are soft and nontender no signs of infection  Imaging: No results found. No images are attached to the encounter.  Labs: No results found for: HGBA1C, ESRSEDRATE, CRP, LABURIC, REPTSTATUS, GRAMSTAIN, CULT, LABORGA   Lab Results  Component Value Date   ALBUMIN 3.9 01/14/2014    No results found for: MG No results found for: VD25OH  No results found for: PREALBUMIN CBC EXTENDED Latest Ref Rng & Units 01/06/2020 09/05/2019 08/31/2019  WBC 4.0 - 10.5 K/uL 3.5(L) 9.2 4.5  RBC 3.87 - 5.11 MIL/uL 4.52 3.47(L) 4.59  HGB 12.0 - 15.0 g/dL 11.2(L) 10.1(L) 13.1  HCT 36 - 46 % 36.6 29.0(L) 40.3  PLT 150 - 400 K/uL 179 159 181     There is no height or weight on file to calculate BMI.  Orders:  Orders Placed This Encounter    Procedures  . XR Foot 2 Views Left   No orders of the defined types were placed in this encounter.    Procedures: No procedures performed  Clinical Data: No additional findings.  ROS:  All other systems negative, except as noted in the HPI. Review of Systems  Objective: Vital Signs: There were no vitals taken for this visit.  Specialty Comments:  No specialty comments available.  PMFS History: Patient Active Problem List   Diagnosis Date Noted  . Lisfranc dislocation, left, sequela   . Status post total replacement of right hip 09/04/2019  . Unilateral primary osteoarthritis, right hip 09/03/2019  . 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 Pelvic floor dysfunction in female 07/17/2019  . Chronic diarrhea 05/27/2019  . Irritable bowel syndrome with diarrhea 05/27/2019  . Bile salt-induced diarrhea 05/27/2019  . Bloating symptom 05/27/2019  . History of cholecystectomy 05/27/2019  . History of repair of hiatal hernia 05/27/2019  . History of fundoplication 08/65/7846  . Sensorineural hearing loss (SNHL) of both ears 03/25/2019  . IFG (impaired fasting glucose) 02/04/2019  . Lumbar degenerative disc disease 07/22/2017  . Lumbar spondylosis 07/22/2017  . Epigastric pain 07/04/2017  . Age related osteoporosis 11/24/2015  . Gastroesophageal reflux disease without esophagitis 11/24/2015  . Mixed hyperlipidemia 11/24/2015  . Vitamin  D deficiency 11/24/2015  . OA (osteoarthritis) of hip 01/18/2014  . Varicose veins of lower extremities with other complications 17/40/8144   Past Medical History:  Diagnosis Date  . Arthritis   . Cancer ([place])    skin cancer on both legs  . Complication of anesthesia   . Difficulty sleeping    DUE TO PAIN  . GERD (gastroesophageal reflux disease)   . History of blood transfusion   . Hyperlipidemia   . IBS (irritable bowel syndrome)    diarrhea  . Osteoporosis   . Peripheral vascular disease ([place])   . PONV  (postoperative nausea and vomiting)   . Prediabetes   . Venous hypertension    varicose veins    Family History  Problem Relation Age of Onset  . Mental illness Mother   . Cancer Mother   . Diabetes Father   . Cancer Father   . Diabetes Brother   . Colon cancer Neg Hx   . Esophageal cancer Neg Hx   . Inflammatory bowel disease Neg Hx   . Liver disease Neg Hx   . Pancreatic cancer Neg Hx   . Rectal cancer Neg Hx   . Stomach cancer Neg Hx     Past Surgical History:  Procedure Laterality Date  . ABDOMINAL HYSTERECTOMY    . BLADDER SUSPENSION  2008  . BREAST ENHANCEMENT SURGERY    . BTL    . CHOLECYSTECTOMY    . COLONOSCOPY    . ENDOVENOUS ABLATION SAPHENOUS VEIN W/ LASER  02-08-2011  right greater saphenous vein    and sclerotherapy  right and left legs  . HERNIA REPAIR     hiatal  . ORIF TOE FRACTURE Left 01/06/2020   Procedure: LEFT FOOT FUSION BASE 1ST METATARSAL AND OPEN REDUCTION INTERNAL FIXATION 2ND METATARSAL;  Surgeon: Newt Minion, MD;  Location: [facility];  Service: Orthopedics;  Laterality: Left;  . SHOULDER ARTHROSCOPY Left   . TONSILLECTOMY    . TOTAL HIP ARTHROPLASTY Left 01/18/2014   Procedure: LEFT TOTAL HIP ARTHROPLASTY ANTERIOR APPROACH;  Surgeon: Gearlean Alf, MD;  Location: [facility];  Service: Orthopedics;  Laterality: Left;  . TOTAL HIP ARTHROPLASTY Right 09/04/2019   Procedure: RIGHT TOTAL HIP ARTHROPLASTY ANTERIOR APPROACH;  Surgeon: Mcarthur Rossetti, MD;  Location: [facility];  Service: Orthopedics;  Laterality: Right;   Social History   Occupational History  . Occupation: accounting  Tobacco Use  . Smoking status: Never Smoker  . Smokeless tobacco: Never Used  Vaping Use  . Vaping Use: Never used  Substance and Sexual Activity  . Alcohol use: Yes    Comment: OCCASIONAL  . Drug use: No  . Sexual activity: Never

## 2020-01-27 ENCOUNTER — Encounter: Payer: Medicare Other | Admitting: Physical Therapy

## 2020-01-29 ENCOUNTER — Ambulatory Visit (INDEPENDENT_AMBULATORY_CARE_PROVIDER_SITE_OTHER): Payer: Medicare Other | Admitting: Physician Assistant

## 2020-01-29 ENCOUNTER — Encounter: Payer: Self-pay | Admitting: Physician Assistant

## 2020-01-29 VITALS — Ht 62.0 in | Wt 150.0 lb

## 2020-01-29 DIAGNOSIS — M1611 Unilateral primary osteoarthritis, right hip: Secondary | ICD-10-CM

## 2020-01-29 NOTE — Progress Notes (Signed)
Office Visit Note   Patient: Dominique Hawkins           Date of Birth: [birth year]           MRN: [id number] Visit Date: 01/29/2020              Requested by: No referring provider defined for this encounter. PCP: Jeannine Boga (Inactive)  Chief Complaint  Patient presents with  . Left Foot - Routine Post Op    01/06/20 left foot fusion base 1st MT ORIF 2nd MT       HPI: Patient presents today 3 weeks status post left first tarsometatarsal arthrodesis.  She has been nonweightbearing.  She is here for routine follow-up.  Assessment & Plan: Visit Diagnoses: No diagnosis found.  Plan: She may begin weightbearing in her boot in 1 week.  She does relate to me that her husband is having eye surgery tomorrow so she will have to place weight on her foot.  Her surgical sutures will be removed today.  She will follow-up in 3 weeks at which time new x-rays of her foot should be taken  Follow-Up Instructions: No follow-ups on file.   Ortho Exam  Patient is alert, oriented, no adenopathy, well-dressed, normal affect, normal respiratory effort. Focused examination demonstrates well-healed surgical incision.  Toes are warm and pink with brisk capillary refill skin is in excellent condition.  Minimal soft tissue swelling compartments are soft and compressible  Imaging: No results found. No images are attached to the encounter.  Labs: No results found for: HGBA1C, ESRSEDRATE, CRP, LABURIC, REPTSTATUS, GRAMSTAIN, CULT, LABORGA   Lab Results  Component Value Date   ALBUMIN 3.9 01/14/2014    No results found for: MG No results found for: VD25OH  No results found for: PREALBUMIN CBC EXTENDED Latest Ref Rng & Units 01/06/2020 09/05/2019 08/31/2019  WBC 4.0 - 10.5 K/uL 3.5(L) 9.2 4.5  RBC 3.87 - 5.11 MIL/uL 4.52 3.47(L) 4.59  HGB 12.0 - 15.0 g/dL 11.2(L) 10.1(L) 13.1  HCT 36 - 46 % 36.6 29.0(L) 40.3  PLT 150 - 400 K/uL 179 159 181     Body mass index is 27.44 kg/m.  Orders:  No  orders of the defined types were placed in this encounter.  No orders of the defined types were placed in this encounter.    Procedures: No procedures performed  Clinical Data: No additional findings.  ROS:  All other systems negative, except as noted in the HPI. Review of Systems  Objective: Vital Signs: Ht 5\' 2"  (1.575 m)   Wt 150 lb (68 kg)   BMI 27.44 kg/m   Specialty Comments:  No specialty comments available.  PMFS History: Patient Active Problem List   Diagnosis Date Noted  . Lisfranc dislocation, left, sequela   . Status post total replacement of right hip 09/04/2019  . Unilateral primary osteoarthritis, right hip 09/03/2019  . 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 Pelvic floor dysfunction in female 07/17/2019  . Chronic diarrhea 05/27/2019  . Irritable bowel syndrome with diarrhea 05/27/2019  . Bile salt-induced diarrhea 05/27/2019  . Bloating symptom 05/27/2019  . History of cholecystectomy 05/27/2019  . History of repair of hiatal hernia 05/27/2019  . History of fundoplication 33/82/5053  . Sensorineural hearing loss (SNHL) of both ears 03/25/2019  . IFG (impaired fasting glucose) 02/04/2019  . Lumbar degenerative disc disease 07/22/2017  . Lumbar spondylosis 07/22/2017  . Epigastric pain 07/04/2017  . Age related osteoporosis 11/24/2015  .  Gastroesophageal reflux disease without esophagitis 11/24/2015  . Mixed hyperlipidemia 11/24/2015  . Vitamin D deficiency 11/24/2015  . OA (osteoarthritis) of hip 01/18/2014  . Varicose veins of lower extremities with other complications 25/07/3974   Past Medical History:  Diagnosis Date  . Arthritis   . Cancer ([place])    skin cancer on both legs  . Complication of anesthesia   . Difficulty sleeping    DUE TO PAIN  . GERD (gastroesophageal reflux disease)   . History of blood transfusion   . Hyperlipidemia   . IBS (irritable bowel syndrome)    diarrhea  . Osteoporosis   . Peripheral  vascular disease ([place])   . PONV (postoperative nausea and vomiting)   . Prediabetes   . Venous hypertension    varicose veins    Family History  Problem Relation Age of Onset  . Mental illness Mother   . Cancer Mother   . Diabetes Father   . Cancer Father   . Diabetes Brother   . Colon cancer Neg Hx   . Esophageal cancer Neg Hx   . Inflammatory bowel disease Neg Hx   . Liver disease Neg Hx   . Pancreatic cancer Neg Hx   . Rectal cancer Neg Hx   . Stomach cancer Neg Hx     Past Surgical History:  Procedure Laterality Date  . ABDOMINAL HYSTERECTOMY    . BLADDER SUSPENSION  2008  . BREAST ENHANCEMENT SURGERY    . BTL    . CHOLECYSTECTOMY    . COLONOSCOPY    . ENDOVENOUS ABLATION SAPHENOUS VEIN W/ LASER  02-08-2011  right greater saphenous vein    and sclerotherapy  right and left legs  . HERNIA REPAIR     hiatal  . ORIF TOE FRACTURE Left 01/06/2020   Procedure: LEFT FOOT FUSION BASE 1ST METATARSAL AND OPEN REDUCTION INTERNAL FIXATION 2ND METATARSAL;  Surgeon: Newt Minion, MD;  Location: [facility];  Service: Orthopedics;  Laterality: Left;  . SHOULDER ARTHROSCOPY Left   . TONSILLECTOMY    . TOTAL HIP ARTHROPLASTY Left 01/18/2014   Procedure: LEFT TOTAL HIP ARTHROPLASTY ANTERIOR APPROACH;  Surgeon: Gearlean Alf, MD;  Location[facility];  Service: Orthopedics;  Laterality: Left;  . TOTAL HIP ARTHROPLASTY Right 09/04/2019   Procedure: RIGHT TOTAL HIP ARTHROPLASTY ANTERIOR APPROACH;  Surgeon: Mcarthur Rossetti, MD;  Location[facility];  Service: Orthopedics;  Laterality: Right;   Social History   Occupational History  . Occupation: accounting  Tobacco Use  . Smoking status: Never Smoker  . Smokeless tobacco: Never Used  Vaping Use  . Vaping Use: Never used  Substance and Sexual Activity  . Alcohol use: Yes    Comment: OCCASIONAL  . Drug use: No  . Sexual activity: Never

## 2020-02-10 ENCOUNTER — Encounter: Payer: Medicare Other | Admitting: Physical Therapy

## 2020-02-22 ENCOUNTER — Encounter: Payer: Self-pay | Admitting: Orthopedic Surgery

## 2020-02-22 ENCOUNTER — Ambulatory Visit (INDEPENDENT_AMBULATORY_CARE_PROVIDER_SITE_OTHER): Payer: Medicare Other | Admitting: Orthopedic Surgery

## 2020-02-22 ENCOUNTER — Ambulatory Visit (INDEPENDENT_AMBULATORY_CARE_PROVIDER_SITE_OTHER): Payer: Medicare Other

## 2020-02-22 VITALS — Ht 62.0 in | Wt 150.0 lb

## 2020-02-22 DIAGNOSIS — M79672 Pain in left foot: Secondary | ICD-10-CM

## 2020-02-22 NOTE — Progress Notes (Signed)
Office Visit Note   Patient: Dominique Hawkins           Date of Birth: 1940/12/15           MRN: [id number] Visit Date: 02/22/2020              Requested by: No referring provider defined for this encounter. PCP: Jeannine Boga (Inactive)  Chief Complaint  Patient presents with  . Left Foot - Follow-up    Left foot fusion 01/06/2020      HPI: Patient is a 79 year old woman who is 6 weeks out from fusion base of the first metatarsal and Lisfranc complex.  Patient states her pain is 4 out of 10 she states she still has some swelling using Aleve for pain a cane and is in soft sneakers.  Assessment & Plan: Visit Diagnoses:  1. Pain in left foot     Plan: Recommended Achilles stretching this was demonstrated recommended a stiff soled sneaker to unload pressure from the midfoot follow-up in 4 weeks  Follow-Up Instructions: Return in about 4 weeks (around 03/21/2020).   Ortho Exam  Patient is alert, oriented, no adenopathy, well-dressed, normal affect, normal respiratory effort. Examination patient does have some swelling the incision is well-healed there is no redness no cellulitis no drainage no signs of infection she has dorsiflexion to neutral with Achilles tightness.  Imaging: XR Foot Complete Left  Result Date: 02/22/2020 Three-view radiographs of the left foot shows a stable fusion across the Lisfranc complex no hardware failure no lucency.  No images are attached to the encounter.  Labs: No results found for: HGBA1C, ESRSEDRATE, CRP, LABURIC, REPTSTATUS, GRAMSTAIN, CULT, LABORGA   Lab Results  Component Value Date   ALBUMIN 3.9 01/14/2014    No results found for: MG No results found for: VD25OH  No results found for: PREALBUMIN CBC EXTENDED Latest Ref Rng & Units 01/06/2020 09/05/2019 08/31/2019  WBC 4.0 - 10.5 K/uL 3.5(L) 9.2 4.5  RBC 3.87 - 5.11 MIL/uL 4.52 3.47(L) 4.59  HGB 12.0 - 15.0 g/dL 11.2(L) 10.1(L) 13.1  HCT 36 - 46 % 36.6 29.0(L) 40.3  PLT 150 -  400 K/uL 179 159 181     Body mass index is 27.44 kg/m.  Orders:  Orders Placed This Encounter  Procedures  . XR Foot Complete Left   No orders of the defined types were placed in this encounter.    Procedures: No procedures performed  Clinical Data: No additional findings.  ROS:  All other systems negative, except as noted in the HPI. Review of Systems  Objective: Vital Signs: Ht 5\' 2"  (1.575 m)   Wt 150 lb (68 kg)   BMI 27.44 kg/m   Specialty Comments:  No specialty comments available.  PMFS History: Patient Active Problem List   Diagnosis Date Noted  . Lisfranc dislocation, left, sequela   . Status post total replacement of right hip 09/04/2019  . Unilateral primary osteoarthritis, right hip 09/03/2019  . 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 Pelvic floor dysfunction in female 07/17/2019  . Chronic diarrhea 05/27/2019  . Irritable bowel syndrome with diarrhea 05/27/2019  . Bile salt-induced diarrhea 05/27/2019  . Bloating symptom 05/27/2019  . History of cholecystectomy 05/27/2019  . History of repair of hiatal hernia 05/27/2019  . History of fundoplication 95/62/1308  . Sensorineural hearing loss (SNHL) of both ears 03/25/2019  . IFG (impaired fasting glucose) 02/04/2019  . Lumbar degenerative disc disease 07/22/2017  . Lumbar spondylosis 07/22/2017  .  Epigastric pain 07/04/2017  . Age related osteoporosis 11/24/2015  . Gastroesophageal reflux disease without esophagitis 11/24/2015  . Mixed hyperlipidemia 11/24/2015  . Vitamin D deficiency 11/24/2015  . OA (osteoarthritis) of hip 01/18/2014  . Varicose veins of lower extremities with other complications 95/28/4132   Past Medical History:  Diagnosis Date  . Arthritis   . Cancer ([place])    skin cancer on both legs  . Complication of anesthesia   . Difficulty sleeping    DUE TO PAIN  . GERD (gastroesophageal reflux disease)   . History of blood transfusion   . Hyperlipidemia   .  IBS (irritable bowel syndrome)    diarrhea  . Osteoporosis   . Peripheral vascular disease ([place])   . PONV (postoperative nausea and vomiting)   . Prediabetes   . Venous hypertension    varicose veins    Family History  Problem Relation Age of Onset  . Mental illness Mother   . Cancer Mother   . Diabetes Father   . Cancer Father   . Diabetes Brother   . Colon cancer Neg Hx   . Esophageal cancer Neg Hx   . Inflammatory bowel disease Neg Hx   . Liver disease Neg Hx   . Pancreatic cancer Neg Hx   . Rectal cancer Neg Hx   . Stomach cancer Neg Hx     Past Surgical History:  Procedure Laterality Date  . ABDOMINAL HYSTERECTOMY    . BLADDER SUSPENSION  2008  . BREAST ENHANCEMENT SURGERY    . BTL    . CHOLECYSTECTOMY    . COLONOSCOPY    . ENDOVENOUS ABLATION SAPHENOUS VEIN W/ LASER  02-08-2011  right greater saphenous vein    and sclerotherapy  right and left legs  . HERNIA REPAIR     hiatal  . ORIF TOE FRACTURE Left 01/06/2020   Procedure: LEFT FOOT FUSION BASE 1ST METATARSAL AND OPEN REDUCTION INTERNAL FIXATION 2ND METATARSAL;  Surgeon: Newt Minion, MD;  Location: [facility];  Service: Orthopedics;  Laterality: Left;  . SHOULDER ARTHROSCOPY Left   . TONSILLECTOMY    . TOTAL HIP ARTHROPLASTY Left 01/18/2014   Procedure: LEFT TOTAL HIP ARTHROPLASTY ANTERIOR APPROACH;  Surgeon: Gearlean Alf, MD;  [facility];  Service: Orthopedics;  Laterality: Left;  . TOTAL HIP ARTHROPLASTY Right 09/04/2019   Procedure: RIGHT TOTAL HIP ARTHROPLASTY ANTERIOR APPROACH;  Surgeon: Mcarthur Rossetti, MD;  [facility];  Service: Orthopedics;  Laterality: Right;   Social History   Occupational History  . Occupation: accounting  Tobacco Use  . Smoking status: Never Smoker  . Smokeless tobacco: Never Used  Vaping Use  . Vaping Use: Never used  Substance and Sexual Activity  . Alcohol use: Yes    Comment: OCCASIONAL  . Drug use: No  . Sexual activity: Never

## 2020-03-16 ENCOUNTER — Other Ambulatory Visit: Payer: Self-pay

## 2020-03-16 ENCOUNTER — Encounter: Payer: Self-pay | Admitting: Orthopaedic Surgery

## 2020-03-16 ENCOUNTER — Ambulatory Visit (INDEPENDENT_AMBULATORY_CARE_PROVIDER_SITE_OTHER): Payer: Medicare Other | Admitting: Orthopaedic Surgery

## 2020-03-16 ENCOUNTER — Ambulatory Visit: Payer: Medicare Other | Admitting: Physician Assistant

## 2020-03-16 DIAGNOSIS — Z96641 Presence of right artificial hip joint: Secondary | ICD-10-CM

## 2020-03-16 MED ORDER — METHYLPREDNISOLONE 4 MG PO TABS: ORAL_TABLET | ORAL | 0 refills | Status: DC

## 2020-03-16 NOTE — Progress Notes (Signed)
The patient is now 6 months status post a right total hip arthroplasty.  She has remote history of the left hip that was done elsewhere.  She also had foot surgery by my partner Dr. Sharol Given in just October.  She is still having some foot swelling.  This is really thrown off her balance and gait and is causing some IT band and trochanteric pain on the right side.  She says her right hip still hurts in the groin and in the thigh.  X-rays from just September of this year showed a well-seated implant bilaterally no complicating features of the hip replacements.  She is walking without assistive device.  On examination of her right hip it does move smoothly and fluidly and she does have significant sensitivity pain over the trochanteric area and IT band.  I do believe her balance and gait has been thrown off due to her left foot recovery from surgery and how she walked different from hip replacement surgery.  This point I would like to send her to outpatient physical therapy to work on any modalities that can decrease the pain of her right hip from trochanteric bursitis and IT band syndrome.  I will try a 6-day steroid taper to see if this will help with the inflammation she is experiencing.  I would like to see her back in about 2 months to see how she is doing overall.  At that visit I would like a standing AP pelvis and a lateral of her more recent operative right hip.

## 2020-03-21 ENCOUNTER — Other Ambulatory Visit: Payer: Self-pay | Admitting: Orthopaedic Surgery

## 2020-03-21 MED ORDER — METHYLPREDNISOLONE 4 MG PO TABS
ORAL_TABLET | ORAL | 0 refills | Status: DC
Start: 2020-03-21 — End: 2020-05-12

## 2020-03-23 ENCOUNTER — Ambulatory Visit: Payer: Medicare Other | Admitting: Rehabilitative and Restorative Service Providers"

## 2020-03-23 ENCOUNTER — Encounter: Payer: Self-pay | Admitting: Rehabilitative and Restorative Service Providers"

## 2020-03-23 ENCOUNTER — Other Ambulatory Visit: Payer: Self-pay

## 2020-03-23 DIAGNOSIS — M25551 Pain in right hip: Secondary | ICD-10-CM

## 2020-03-23 DIAGNOSIS — M25651 Stiffness of right hip, not elsewhere classified: Secondary | ICD-10-CM | POA: Diagnosis not present

## 2020-03-23 DIAGNOSIS — M6281 Muscle weakness (generalized): Secondary | ICD-10-CM

## 2020-03-23 DIAGNOSIS — R262 Difficulty in walking, not elsewhere classified: Secondary | ICD-10-CM | POA: Diagnosis not present

## 2020-03-23 NOTE — Patient Instructions (Signed)
Access Code: [email or web address] URL: [email or web address] Date: 03/23/2020 Prepared by: Pauletta Browns  Exercises Single Knee to Chest Stretch - 2-3 x daily - 7 x weekly - 1 sets - 5 reps - 20 seconds hold Supine Figure 4 Piriformis Stretch - 2-3 x daily - 7 x weekly - 1 sets - 5 reps - 20 seconds hold Bridge - 2-3 x daily - 7 x weekly - 1-2 sets - 10 reps - 5 seconds hold Standing Hip Hiking - 2-3 x daily - 7 x weekly - 1-2 sets - 10 reps - 3 seconds hold

## 2020-03-23 NOTE — Therapy (Signed)
[facility] [address] [place], [place], [address] Phone: [phone]   Fax:  [phone]  Physical Therapy Evaluation  Patient Details  Name: Dominique Hawkins MRN: [id number] Date of Birth: 1940-05-02 Referring Provider (PT): Mcarthur Rossetti MD   Encounter Date: 03/23/2020   PT End of Session - 03/23/20 1338    Visit Number 1    Number of Visits 16    Date for PT Re-Evaluation 05/20/20    PT Start Time 1140    PT Stop Time 1227    PT Time Calculation (min) 47 min    Activity Tolerance Patient tolerated treatment well;No increased pain    Behavior During Therapy WFL for tasks assessed/performed           Past Medical History:  Diagnosis Date  . Arthritis   . Cancer ([place])    skin cancer on both legs  . Complication of anesthesia   . Difficulty sleeping    DUE TO PAIN  . GERD (gastroesophageal reflux disease)   . History of blood transfusion   . Hyperlipidemia   . IBS (irritable bowel syndrome)    diarrhea  . Osteoporosis   . Peripheral vascular disease ([place])   . PONV (postoperative nausea and vomiting)   . Prediabetes   . Venous hypertension    varicose veins    Past Surgical History:  Procedure Laterality Date  . ABDOMINAL HYSTERECTOMY    . BLADDER SUSPENSION  2008  . BREAST ENHANCEMENT SURGERY    . BTL    . CHOLECYSTECTOMY    . COLONOSCOPY    . ENDOVENOUS ABLATION SAPHENOUS VEIN W/ LASER  02-08-2011  right greater saphenous vein    and sclerotherapy  right and left legs  . HERNIA REPAIR     hiatal  . ORIF TOE FRACTURE Left 01/06/2020   Procedure: LEFT FOOT FUSION BASE 1ST METATARSAL AND OPEN REDUCTION INTERNAL FIXATION 2ND METATARSAL;  Surgeon: Newt Minion, MD;  Location: [place] [facility];  Service: Orthopedics;  Laterality: Left;  . SHOULDER ARTHROSCOPY Left   . TONSILLECTOMY    . TOTAL HIP ARTHROPLASTY Left 01/18/2014   Procedure: LEFT TOTAL HIP ARTHROPLASTY ANTERIOR APPROACH;  Surgeon: Gearlean Alf, MD;   [facility];  Service: Orthopedics;  Laterality: Left;  . TOTAL HIP ARTHROPLASTY Right 09/04/2019   Procedure: RIGHT TOTAL HIP ARTHROPLASTY ANTERIOR APPROACH;  Surgeon: Mcarthur Rossetti, MD;  Location[facility];  Service: Orthopedics;  Laterality: Right;    There were no vitals filed for this visit.    Subjective Assessment - 03/23/20 1316    Subjective Dominique Hawkins has had increasing R hip pain after a foot surgery on 01/06/2020.  Her R hip was replaced on 09/04/2019 and she reports she did not have PT afterwards.  It appears that the difficulty walking pre and post foot surgery has flared up her R trochanteric bursa.  Lingering weakness of the R hip is also present.    Pertinent History Previously noted R foot surgery, R THA (08/2019) and L THA (2015).    Limitations Sitting;Standing;Walking    How long can you sit comfortably? ?    How long can you stand comfortably? 10 minutes?    How long can you walk comfortably? < 10 minutes    Patient Stated Goals Sleep, sit, stand and walk without pain.    Currently in Pain? Yes    Pain Score 3     Pain Location Hip    Pain Orientation Right  Pain Descriptors / Indicators Aching;Burning;Constant;Nagging;Numbness;Sore;Tightness    Pain Type Chronic pain    Pain Onset More than a month ago    Pain Frequency Constant    Aggravating Factors  Prolonged postures    Pain Relieving Factors Change of position and some relief with steroid prescription    Effect of Pain on Daily Activities Limits endurance with activities, particularly weight-bearing.    Multiple Pain Sites No              [facility] PT Assessment - 03/23/20 0001      Assessment   Medical Diagnosis R hip bursitis (THA 08/2019)    Referring Provider (PT) Kathryne Hitch MD    Onset Date/Surgical Date --   Worse after being off her feet due to foot fracture   Next MD Visit February      Balance Screen   Has the patient fallen in the past 6 months No    Has the patient had a  decrease in activity level because of a fear of falling?  No    Is the patient reluctant to leave their home because of a fear of falling?  No      Prior Function   Level of Independence Independent      Cognition   Overall Cognitive Status Within Functional Limits for tasks assessed      Observation/Other Assessments   Focus on Therapeutic Outcomes (FOTO)  44 (Goal 61)      ROM / Strength   AROM / PROM / Strength AROM;Strength      AROM   Overall AROM  Deficits    AROM Assessment Site Hip    Right/Left Hip Left;Right    Right Hip Flexion 75    Right Hip External Rotation  25    Right Hip Internal Rotation  5    Left Hip Flexion 95    Left Hip External Rotation  32    Left Hip Internal Rotation  20      Strength   Overall Strength Deficits    Strength Assessment Site Hip    Right/Left Hip Left;Right    Right Hip Flexion 4-/5    Right Hip ABduction 4-/5    Left Hip Flexion 4+/5    Left Hip ABduction 4+/5      Flexibility   Soft Tissue Assessment /Muscle Length yes    Hamstrings 50 dgrees L/50 degrees R                      Objective measurements completed on examination: See above findings.       [facility] Adult PT Treatment/Exercise - 03/23/20 0001      Exercises   Exercises Knee/Hip      Knee/Hip Exercises: Stretches   Hip Flexor Stretch Both;5 reps;20 seconds   Single knee to chest with other leg straight   Piriformis Stretch Both;5 reps;20 seconds;Other (comment)    Piriformis Stretch Limitations Figure 4 stretch      Knee/Hip Exercises: Standing   Other Standing Knee Exercises Alternating hip hike 2 sets of 10 3 seconds      Knee/Hip Exercises: Supine   Bridges Strengthening;Both;2 sets;10 reps   5 second hold                 PT Education - 03/23/20 1337    Education Details Reviewed her new HEP prescription, discussed etiology of hip bursitis.    Person(s) Educated Patient    Methods Explanation;Verbal cues;Handout  Comprehension Verbalized understanding;Need further instruction;Returned demonstration;Verbal cues required               PT Long Term Goals - 03/23/20 1720      PT LONG TERM GOAL #1   Title Improve FOTO to 61.    Baseline 44    Time 8    Period Weeks    Status New    Target Date 05/27/20      PT LONG TERM GOAL #2   Title Improve B hip AROM for flexion to 100 degrees and IR to 0 degrees.    Baseline See objective    Time 8    Period Weeks    Status New      PT LONG TERM GOAL #3   Title Improve hip flexors and abductors strength to 5/5 MMT.    Baseline See objective    Time 8    Period Weeks    Status New    Target Date 05/27/20      PT LONG TERM GOAL #4   Title Dominique Hawkins will be independent with her long-term HEP at [place].    Time 8    Period Weeks    Status New    Target Date 05/27/20                  Plan - 03/23/20 1338    Clinical Impression Statement Dominique Hawkins has had both hips replaced.  She was doing well after her R hip THA in June of this year until her foot started to bother her.  Leading up to and after foot surgery, her gait changed and her hip started to bother her.  She appears to have a trochanteric bursitis along with some general stiffness and weakness of the R hip.  Her prognosis to meet LTGs is good with the recommended course of treatment.    Personal Factors and Comorbidities Comorbidity 1    Comorbidities B THA, 12/2019 foot surgery (fracture).    Examination-Activity Limitations Sleep;Sit;Squat;Locomotion Level;Stairs;Stand    Examination-Participation Restrictions Community Activity;Driving    Stability/Clinical Decision Making Stable/Uncomplicated    Clinical Decision Making Low    Rehab Potential Good    PT Frequency 2x / week    PT Duration 8 weeks    PT Treatment/Interventions ADLs/Self Care Home Management;Cryotherapy;Therapeutic activities;Stair training;Gait training;Therapeutic exercise;Balance training;Neuromuscular  re-education;Patient/family education;Manual techniques;Dry needling    PT Next Visit Plan Continue hip stretching.  Possibly progress general and hip strength and balance.    PT Home Exercise Plan Access Code: [email or web address]    Consulted and Agree with Plan of Care Patient           Patient will benefit from skilled therapeutic intervention in order to improve the following deficits and impairments:  Abnormal gait,Decreased activity tolerance,Decreased balance,Decreased endurance,Decreased range of motion,Decreased strength,Difficulty walking,Increased edema,Impaired flexibility,Pain  Visit Diagnosis: Difficulty walking  Muscle weakness (generalized)  Stiffness of right hip, not elsewhere classified  Pain in right hip     Problem List Patient Active Problem List   Diagnosis Date Noted  . Lisfranc dislocation, left, sequela   . Status post total replacement of right hip 09/04/2019  . Unilateral primary osteoarthritis, right hip 09/03/2019  . 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 Pelvic floor dysfunction in female 07/17/2019  . Chronic diarrhea 05/27/2019  . Irritable bowel syndrome with diarrhea 05/27/2019  . Bile salt-induced diarrhea 05/27/2019  . Bloating symptom 05/27/2019  . History of cholecystectomy 05/27/2019  . History of repair  of hiatal hernia 05/27/2019  . History of fundoplication [id number]  . Sensorineural hearing loss (SNHL) of both ears 03/25/2019  . IFG (impaired fasting glucose) 02/04/2019  . Lumbar degenerative disc disease 07/22/2017  . Lumbar spondylosis 07/22/2017  . Epigastric pain 07/04/2017  . Age related osteoporosis 11/24/2015  . Gastroesophageal reflux disease without esophagitis 11/24/2015  . Mixed hyperlipidemia 11/24/2015  . Vitamin D deficiency 11/24/2015  . OA (osteoarthritis) of hip 01/18/2014  . Varicose veins of lower extremities with other complications [id number]    Farley Ly PT, MPT 03/23/2020, 5:24 PM  [facility] Physical Therapy [address] [place], [place], [address] Phone: [phone]   Fax:  [phone]  Name: Dominique Hawkins MRN: [id number] Date of Birth: Oct 20, 1940

## 2020-03-24 ENCOUNTER — Ambulatory Visit (INDEPENDENT_AMBULATORY_CARE_PROVIDER_SITE_OTHER): Payer: Medicare Other

## 2020-03-24 ENCOUNTER — Ambulatory Visit (INDEPENDENT_AMBULATORY_CARE_PROVIDER_SITE_OTHER): Payer: Medicare Other | Admitting: Orthopedic Surgery

## 2020-03-24 ENCOUNTER — Encounter: Payer: Self-pay | Admitting: Orthopedic Surgery

## 2020-03-24 DIAGNOSIS — M79672 Pain in left foot: Secondary | ICD-10-CM | POA: Diagnosis not present

## 2020-03-24 NOTE — Progress Notes (Signed)
Office Visit Note   Patient: Dominique Hawkins           Date of Birth: 08-30-1940           MRN: [id number] Visit Date: 03/24/2020              Requested by: No referring provider defined for this encounter. PCP: Earley Abide (Inactive)  Chief Complaint  Patient presents with   Left Foot - Pain, Follow-up      HPI: Patient is a 79 year old woman who presents about 10 weeks status post fusion across the base of the first metatarsal medial cuneiform and Lisfranc stabilization with internal fixation.  Patient complains of pain over some prominent veins on the medial aspect of her left foot.  Patient denies any calf tenderness denies any shortness of breath.  Assessment & Plan: Visit Diagnoses:  1. Pain in left foot     Plan: Recommended a knee-high compression sock size medium recommended an aspirin once a day patient may have some symptomatic veins in her foot but no evidence of a DVT.  Follow-Up Instructions: No follow-ups on file.   Ortho Exam  Patient is alert, oriented, no adenopathy, well-dressed, normal affect, normal respiratory effort. Examination patient has good pulses in her foot she has dorsiflexion of the ankle past neutral.  There is no pain in the calf with ankle dorsiflexion there is no pain in the calf to palpation there is no calf swelling.  Her calf measures 35 cm in circumference.  Patient has no redness or cellulitis in her foot she has a stiff soled shoe she is tender directly over some medial veins in her foot.  Radiograph shows stable internal fixation.  Imaging: XR Foot Complete Left  Result Date: 03/24/2020 Three-view radiographs of the left foot shows stable fusion base of the first metatarsal and stable internal fixation for the Lisfranc complex.  No hardware failure no bony abnormalities.  No images are attached to the encounter.  Labs: No results found for: HGBA1C, ESRSEDRATE, CRP, LABURIC, REPTSTATUS, GRAMSTAIN, CULT, LABORGA   Lab  Results  Component Value Date   ALBUMIN 3.9 01/14/2014    No results found for: MG No results found for: VD25OH  No results found for: PREALBUMIN CBC EXTENDED Latest Ref Rng & Units 01/06/2020 09/05/2019 08/31/2019  WBC 4.0 - 10.5 K/uL 3.5(L) 9.2 4.5  RBC 3.87 - 5.11 MIL/uL 4.52 3.47(L) 4.59  HGB 12.0 - 15.0 g/dL 11.2(L) 10.1(L) 13.1  HCT 36.0 - 46.0 % 36.6 29.0(L) 40.3  PLT 150 - 400 K/uL 179 159 181     There is no height or weight on file to calculate BMI.  Orders:  Orders Placed This Encounter  Procedures   XR Foot Complete Left   No orders of the defined types were placed in this encounter.    Procedures: No procedures performed  Clinical Data: No additional findings.  ROS:  All other systems negative, except as noted in the HPI. Review of Systems  Objective: Vital Signs: There were no vitals taken for this visit.  Specialty Comments:  No specialty comments available.  PMFS History: Patient Active Problem List   Diagnosis Date Noted   Lisfranc dislocation, left, sequela    Status post total replacement of right hip 09/04/2019   Unilateral primary osteoarthritis, right hip 09/03/2019   Fecal soiling due to fecal incontinence 07/17/2019   Pelvic floor dysfunction in female 07/17/2019   Chronic diarrhea 05/27/2019   Irritable bowel syndrome with diarrhea  05/27/2019   Bile salt-induced diarrhea 05/27/2019   Bloating symptom 05/27/2019   History of cholecystectomy 05/27/2019   History of repair of hiatal hernia 05/27/2019   History of fundoplication [id number]   Sensorineural hearing loss (SNHL) of both ears 03/25/2019   IFG (impaired fasting glucose) 02/04/2019   Lumbar degenerative disc disease 07/22/2017   Lumbar spondylosis 07/22/2017   Epigastric pain 07/04/2017   Age related osteoporosis 11/24/2015   Gastroesophageal reflux disease without esophagitis 11/24/2015   Mixed hyperlipidemia 11/24/2015   Vitamin D deficiency  11/24/2015   OA (osteoarthritis) of hip 01/18/2014   Varicose veins of lower extremities with other complications [id number]   Past Medical History:  Diagnosis Date   Arthritis    Cancer ([place])    skin cancer on both legs   Complication of anesthesia    Difficulty sleeping    DUE TO PAIN   GERD (gastroesophageal reflux disease)    History of blood transfusion    Hyperlipidemia    IBS (irritable bowel syndrome)    diarrhea   Osteoporosis    Peripheral vascular disease ([facility])    PONV (postoperative nausea and vomiting)    Prediabetes    Venous hypertension    varicose veins    Family History  Problem Relation Age of Onset   Mental illness Mother    Cancer Mother    Diabetes Father    Cancer Father    Diabetes Brother    Colon cancer Neg Hx    Esophageal cancer Neg Hx    Inflammatory bowel disease Neg Hx    Liver disease Neg Hx    Pancreatic cancer Neg Hx    Rectal cancer Neg Hx    Stomach cancer Neg Hx     Past Surgical History:  Procedure Laterality Date   ABDOMINAL HYSTERECTOMY     BLADDER SUSPENSION  2008   BREAST ENHANCEMENT SURGERY     BTL     CHOLECYSTECTOMY     COLONOSCOPY     ENDOVENOUS ABLATION SAPHENOUS VEIN W/ LASER  02-08-2011  right greater saphenous vein    and sclerotherapy  right and left legs   HERNIA REPAIR     hiatal   ORIF TOE FRACTURE Left 01/06/2020   Procedure: LEFT FOOT FUSION BASE 1ST METATARSAL AND OPEN REDUCTION INTERNAL FIXATION 2ND METATARSAL;  Surgeon: Newt Minion, MD;  Location: [facility];  Service: Orthopedics;  Laterality: Left;   SHOULDER ARTHROSCOPY Left    TONSILLECTOMY     TOTAL HIP ARTHROPLASTY Left 01/18/2014   Procedure: LEFT TOTAL HIP ARTHROPLASTY ANTERIOR APPROACH;  Surgeon: Gearlean Alf, MD;  [facility];  Service: Orthopedics;  Laterality: Left;   TOTAL HIP ARTHROPLASTY Right 09/04/2019   Procedure: RIGHT TOTAL HIP ARTHROPLASTY ANTERIOR APPROACH;  Surgeon: Mcarthur Rossetti, MD;  [facility];  Service: Orthopedics;  Laterality: Right;   Social History   Occupational History   Occupation: Press photographer  Tobacco Use   Smoking status: Never Smoker   Smokeless tobacco: Never Used  Scientific laboratory technician Use: Never used  Substance and Sexual Activity   Alcohol use: Yes    Comment: OCCASIONAL   Drug use: No   Sexual activity: Never

## 2020-04-06 ENCOUNTER — Other Ambulatory Visit: Payer: Self-pay

## 2020-04-06 ENCOUNTER — Encounter: Payer: Self-pay | Admitting: Physical Therapy

## 2020-04-06 ENCOUNTER — Ambulatory Visit (INDEPENDENT_AMBULATORY_CARE_PROVIDER_SITE_OTHER): Payer: Medicare Other | Admitting: Physical Therapy

## 2020-04-06 DIAGNOSIS — R262 Difficulty in walking, not elsewhere classified: Secondary | ICD-10-CM | POA: Diagnosis not present

## 2020-04-06 DIAGNOSIS — M6281 Muscle weakness (generalized): Secondary | ICD-10-CM | POA: Diagnosis not present

## 2020-04-06 DIAGNOSIS — M25651 Stiffness of right hip, not elsewhere classified: Secondary | ICD-10-CM

## 2020-04-06 DIAGNOSIS — M25551 Pain in right hip: Secondary | ICD-10-CM

## 2020-04-06 NOTE — Patient Instructions (Signed)
Access Code: [email or web address] URL: [email or web address] Date: 04/06/2020 Prepared by: Faustino Congress  Exercises Single Knee to Chest Stretch - 2-3 x daily - 7 x weekly - 1 sets - 5 reps - 20 seconds hold Supine Figure 4 Piriformis Stretch - 2-3 x daily - 7 x weekly - 1 sets - 5 reps - 20 seconds hold Bridge - 2-3 x daily - 7 x weekly - 1-2 sets - 10 reps - 5 seconds hold Standing Hip Hiking - 2-3 x daily - 7 x weekly - 1-2 sets - 10 reps - 3 seconds hold Hooklying Isometric Clamshell - 2-3 x daily - 7 x weekly - 1-2 sets - 15 reps Clamshell - 2-3 x daily - 7 x weekly - 1-2 sets - 10 reps

## 2020-04-06 NOTE — Therapy (Signed)
[place], [place], [address] Phone: [phone]   Fax:  [phone]  Physical Therapy Treatment  Patient Details  Name: Dominique Hawkins MRN: [id number] Date of Birth: 1940-12-02 Referring Provider (PT): Dominique Rossetti MD   Encounter Date: 04/06/2020   PT End of Session - 04/06/20 0946    Visit Number 2    Number of Visits 16    Date for PT Re-Evaluation 05/20/20    PT Start Time 0845    PT Stop Time 0925    PT Time Calculation (min) 40 min    Activity Tolerance Patient tolerated treatment well;No increased pain    Behavior During Therapy WFL for tasks assessed/performed           Past Medical History:  Diagnosis Date  . Arthritis   . Cancer ([place])    skin cancer on both legs  . Complication of anesthesia   . Difficulty sleeping    DUE TO PAIN  . GERD (gastroesophageal reflux disease)   . History of blood transfusion   . Hyperlipidemia   . IBS (irritable bowel syndrome)    diarrhea  . Osteoporosis   . Peripheral vascular disease ([place])   . PONV (postoperative nausea and vomiting)   . Prediabetes   . Venous hypertension    varicose veins    Past Surgical History:  Procedure Laterality Date  . ABDOMINAL HYSTERECTOMY    . BLADDER SUSPENSION  2008  . BREAST ENHANCEMENT SURGERY    . BTL    . CHOLECYSTECTOMY    . COLONOSCOPY    . ENDOVENOUS ABLATION SAPHENOUS VEIN W/ LASER  02-08-2011  right greater saphenous vein    and sclerotherapy  right and left legs  . HERNIA REPAIR     hiatal  . ORIF TOE FRACTURE Left 01/06/2020   Procedure: LEFT FOOT FUSION BASE 1ST METATARSAL AND OPEN REDUCTION INTERNAL FIXATION 2ND METATARSAL;  Surgeon: Dominique Minion, MD;  Location: [facility];  Service: Orthopedics;  Laterality: Left;  . SHOULDER ARTHROSCOPY Left   . TONSILLECTOMY    . TOTAL HIP ARTHROPLASTY Left 01/18/2014   Procedure: LEFT TOTAL HIP ARTHROPLASTY ANTERIOR APPROACH;  Surgeon: Dominique Alf, MD;   [facility];  Service: Orthopedics;  Laterality: Left;  . TOTAL HIP ARTHROPLASTY Right 09/04/2019   Procedure: RIGHT TOTAL HIP ARTHROPLASTY ANTERIOR APPROACH;  Surgeon: Dominique Rossetti, MD;  [facility];  Service: Orthopedics;  Laterality: Right;    There were no vitals filed for this visit.   Subjective Assessment - 04/06/20 0849    Subjective feeling better overall - still having some pain but it's improved    Pertinent History Previously noted R foot surgery, R THA (08/2019) and L THA (2015).    Limitations Sitting;Standing;Walking    How long can you sit comfortably? ?    How long can you stand comfortably? 10 minutes?    How long can you walk comfortably? < 10 minutes    Patient Stated Goals Sleep, sit, stand and walk without pain.    Currently in Pain? Yes    Pain Score 3     Pain Location Hip    Pain Orientation Right    Pain Descriptors / Indicators Aching;Burning;Nagging;Numbness;Sore;Tightness    Pain Onset More than a month ago    Pain Frequency Constant    Aggravating Factors  prolonged positioning    Pain Relieving Factors changing positions  [facility] Adult PT Treatment/Exercise - 04/06/20 0851      Knee/Hip Exercises: Stretches   Piriformis Stretch Right;Left;3 reps;30 seconds    Piriformis Stretch Limitations knee to opposite stretch    Other Knee/Hip Stretches SKTC 3x30 sec bil      Knee/Hip Exercises: Aerobic   Nustep L5 x 8 min      Knee/Hip Exercises: Standing   Other Standing Knee Exercises Alternating hip hike 2 sets of 10 3 seconds      Knee/Hip Exercises: Supine   Bridges Both;10 reps    Other Supine Knee/Hip Exercises single limb hooklying clamshell x 10 reps bil; L3 band      Knee/Hip Exercises: Sidelying   Clams x10 reps bil; trial with L3 band - increased pain so no resistance used today                  PT Education - 04/06/20 0946    Education Details added to HEP     Person(s) Educated Patient    Methods Explanation;Demonstration;Handout    Comprehension Verbalized understanding;Returned demonstration;Need further instruction               PT Long Term Goals - 04/06/20 0947      PT LONG TERM GOAL #1   Title Improve FOTO to 61.    Baseline 44    Time 8    Period Weeks    Status On-going    Target Date 05/27/20      PT LONG TERM GOAL #2   Title Improve B hip AROM for flexion to 100 degrees and IR to 0 degrees.    Baseline See objective    Time 8    Period Weeks    Status On-going      PT LONG TERM GOAL #3   Title Improve hip flexors and abductors strength to 5/5 MMT.    Baseline See objective    Time 8    Period Weeks    Status On-going      PT LONG TERM GOAL #4   Title [name] will be independent with her long-term HEP at [place].    Time 8    Period Weeks    Status On-going                 Plan - 04/06/20 0947    Clinical Impression Statement Pt tolerated session well today and feels overall pain is slowly improving.  Added a couple exercises to HEP today to address hip weakness.  Will continue to benefit from PT to maximize function.  All goals ongoing at this time.    Personal Factors and Comorbidities Comorbidity 1    Comorbidities B THA, 12/2019 foot surgery (fracture).    Examination-Activity Limitations Sleep;Sit;Squat;Locomotion Level;Stairs;Stand    Examination-Participation Restrictions Community Activity;Driving    Stability/Clinical Decision Making Stable/Uncomplicated    Rehab Potential Good    PT Frequency 2x / week    PT Duration 8 weeks    PT Treatment/Interventions ADLs/Self Care Home Management;Cryotherapy;Therapeutic activities;Stair training;Gait training;Therapeutic exercise;Balance training;Neuromuscular re-education;Patient/family education;Manual techniques;Dry needling    PT Next Visit Plan Continue hip stretching.  Possibly progress general and hip strength and balance.    PT Home Exercise Plan Access  Code: [email or web address]    Consulted and Agree with Plan of Care Patient           Patient will benefit from skilled therapeutic intervention in order to improve the following deficits and impairments:  Abnormal gait,Decreased activity tolerance,Decreased balance,Decreased endurance,Decreased  range of motion,Decreased strength,Difficulty walking,Increased edema,Impaired flexibility,Pain  Visit Diagnosis: Difficulty walking  Muscle weakness (generalized)  Stiffness of right hip, not elsewhere classified  Pain in right hip     Problem List Patient Active Problem List   Diagnosis Date Noted  . Lisfranc dislocation, left, sequela   . Status post total replacement of right hip 09/04/2019  . Unilateral primary osteoarthritis, right hip 09/03/2019  . 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 Pelvic floor dysfunction in female 07/17/2019  . Chronic diarrhea 05/27/2019  . Irritable bowel syndrome with diarrhea 05/27/2019  . Bile salt-induced diarrhea 05/27/2019  . Bloating symptom 05/27/2019  . History of cholecystectomy 05/27/2019  . History of repair of hiatal hernia 05/27/2019  . History of fundoplication 68/01/5725  . Sensorineural hearing loss (SNHL) of both ears 03/25/2019  . IFG (impaired fasting glucose) 02/04/2019  . Lumbar degenerative disc disease 07/22/2017  . Lumbar spondylosis 07/22/2017  . Epigastric pain 07/04/2017  . Age related osteoporosis 11/24/2015  . Gastroesophageal reflux disease without esophagitis 11/24/2015  . Mixed hyperlipidemia 11/24/2015  . Vitamin D deficiency 11/24/2015  . OA (osteoarthritis) of hip 01/18/2014  . Varicose veins of lower extremities with other complications 20/35/5974      Laureen Abrahams, PT, DPT 04/06/20 9:50 AM    [facility] Physical Therapy [address] [place], [place], [address] Phone: [phone]   Fax:  [phone]  Name: Dominique Hawkins MRN: [id number] Date of Birth:  12/28/1940

## 2020-04-08 ENCOUNTER — Other Ambulatory Visit: Payer: Self-pay

## 2020-04-08 ENCOUNTER — Ambulatory Visit: Payer: Medicare Other | Admitting: Rehabilitative and Restorative Service Providers"

## 2020-04-08 ENCOUNTER — Encounter: Payer: Self-pay | Admitting: Rehabilitative and Restorative Service Providers"

## 2020-04-08 DIAGNOSIS — R262 Difficulty in walking, not elsewhere classified: Secondary | ICD-10-CM

## 2020-04-08 DIAGNOSIS — M25551 Pain in right hip: Secondary | ICD-10-CM | POA: Diagnosis not present

## 2020-04-08 DIAGNOSIS — M25651 Stiffness of right hip, not elsewhere classified: Secondary | ICD-10-CM | POA: Diagnosis not present

## 2020-04-08 DIAGNOSIS — M6281 Muscle weakness (generalized): Secondary | ICD-10-CM

## 2020-04-08 NOTE — Therapy (Signed)
[facility] Physical Therapy [address] [place], [place], [address] Phone: [phone]   Fax:  [phone]  Physical Therapy Treatment  Patient Details  Name: Dominique Hawkins MRN: [id number] Date of Birth: [birth year] Referring Provider (PT): Kathryne Hitch MD   Encounter Date: 04/08/2020   PT End of Session - 04/08/20 0931    Visit Number 3    Number of Visits 16    Date for PT Re-Evaluation 05/20/20    PT Start Time 0848    PT Stop Time 0927    PT Time Calculation (min) 39 min    Activity Tolerance Patient tolerated treatment well;No increased pain    Behavior During Therapy WFL for tasks assessed/performed           Past Medical History:  Diagnosis Date  . Arthritis   . Cancer (HCC)    skin cancer on both legs  . Complication of anesthesia   . Difficulty sleeping    DUE TO PAIN  . GERD (gastroesophageal reflux disease)   . History of blood transfusion   . Hyperlipidemia   . IBS (irritable bowel syndrome)    diarrhea  . Osteoporosis   . Peripheral vascular disease (HCC)   . PONV (postoperative nausea and vomiting)   . Prediabetes   . Venous hypertension    varicose veins    Past Surgical History:  Procedure Laterality Date  . ABDOMINAL HYSTERECTOMY    . BLADDER SUSPENSION  2008  . BREAST ENHANCEMENT SURGERY    . BTL    . CHOLECYSTECTOMY    . COLONOSCOPY    . ENDOVENOUS ABLATION SAPHENOUS VEIN W/ LASER  02-08-2011  right greater saphenous vein    and sclerotherapy  right and left legs  . HERNIA REPAIR     hiatal  . ORIF TOE FRACTURE Left 01/06/2020   Procedure: LEFT FOOT FUSION BASE 1ST METATARSAL AND OPEN REDUCTION INTERNAL FIXATION 2ND METATARSAL;  Surgeon: Nadara Mustard, MD;  Location: [facility];  Service: Orthopedics;  Laterality: Left;  . SHOULDER ARTHROSCOPY Left   . TONSILLECTOMY    . TOTAL HIP ARTHROPLASTY Left 01/18/2014   Procedure: LEFT TOTAL HIP ARTHROPLASTY ANTERIOR APPROACH;  Surgeon: Loanne Drilling, MD;   Location: [facility];  Service: Orthopedics;  Laterality: Left;  . TOTAL HIP ARTHROPLASTY Right 09/04/2019   Procedure: RIGHT TOTAL HIP ARTHROPLASTY ANTERIOR APPROACH;  Surgeon: Kathryne Hitch, MD;  Location: [facility];  Service: Orthopedics;  Laterality: Right;    There were no vitals filed for this visit.   Subjective Assessment - 04/08/20 0924    Subjective Dominique Hawkins notes significant progress in her night time hip pain.  She does notice the weakness on the R side.    Pertinent History Previously noted R foot surgery, R THA (08/2019) and L THA (2015).    Limitations Sitting;Standing;Walking    How long can you sit comfortably? ?    How long can you stand comfortably? 10 minutes?    How long can you walk comfortably? < 10 minutes    Patient Stated Goals Sleep, sit, stand and walk without pain.    Currently in Pain? Yes    Pain Score 2     Pain Location Hip    Pain Orientation Right    Pain Descriptors / Indicators Aching;Sore;Tightness    Pain Type Chronic pain    Pain Onset More than a month ago    Pain Frequency Constant    Aggravating Factors  Prolonged postures  Pain Relieving Factors Change of position    Effect of Pain on Daily Activities Limited endurance with WB function.  No longer uses a cane with gait 04/08/2020.    Multiple Pain Sites No                             [facility] Adult PT Treatment/Exercise - 04/08/20 0001      Neuro Re-ed    Neuro Re-ed Details  Tandem balance eyes open/head turning/eyes closed 3X each for 20 seconds      Exercises   Exercises Knee/Hip      Knee/Hip Exercises: Stretches   Hip Flexor Stretch Both;5 reps;20 seconds   Single knee to chest with other leg straight   Piriformis Stretch Both;5 reps;20 seconds;Other (comment)    Piriformis Stretch Limitations Figure 4 stretch      Knee/Hip Exercises: Machines for Strengthening   Cybex Leg Press Double leg 100# 10X slow eccentrics and single leg 50# 2 sets of 10 slow eccentrics       Knee/Hip Exercises: Standing   Other Standing Knee Exercises Alternating hip hike 2 sets of 10 3 seconds      Knee/Hip Exercises: Supine   Bridges Strengthening;Both;2 sets;10 reps   5 second hold                 PT Education - 04/08/20 0930    Education Details Reviewed HEP with emphasis on 2-3X/day compliance and R hip/leg strengthening.  Progressions made.    Person(s) Educated Patient    Methods Explanation;Demonstration;Verbal cues;Handout    Comprehension Verbal cues required;Need further instruction;Returned demonstration;Verbalized understanding               PT Long Term Goals - 04/08/20 0931      PT LONG TERM GOAL #1   Title Improve FOTO to 61.    Baseline 44    Time 8    Period Weeks    Status On-going      PT LONG TERM GOAL #2   Title Improve B hip AROM for flexion to 100 degrees and IR to 0 degrees.    Baseline See objective    Time 8    Period Weeks    Status On-going      PT LONG TERM GOAL #3   Title Improve hip flexors and abductors strength to 5/5 MMT.    Baseline See objective    Time 8    Period Weeks    Status On-going      PT LONG TERM GOAL #4   Title Dominique Hawkins will be independent with her long-term HEP at [place].    Time 8    Period Weeks    Status On-going                 Plan - 04/08/20 0932    Clinical Impression Statement Dominique Hawkins notes less pain at night and good HEP compliance.  We will need to add activities to address hip flexion weakness along with current emphasis on gluteal and general R hip strengthening.  Prognosis remains good to meet all LTGs.    Personal Factors and Comorbidities Comorbidity 1    Comorbidities B THA, 12/2019 foot surgery (fracture).    Examination-Activity Limitations Sleep;Sit;Squat;Locomotion Level;Stairs;Stand    Examination-Participation Restrictions Community Activity;Driving    Stability/Clinical Decision Making Stable/Uncomplicated    Rehab Potential Good    PT Frequency 2x / week    PT  Duration  8 weeks    PT Treatment/Interventions ADLs/Self Care Home Management;Cryotherapy;Therapeutic activities;Stair training;Gait training;Therapeutic exercise;Balance training;Neuromuscular re-education;Patient/family education;Manual techniques;Dry needling    PT Next Visit Plan Continue hip stretching.  Possibly progress general and hip strength and balance.    PT Home Exercise Plan Access Code: [email or web address]    Consulted and Agree with Plan of Care Patient           Patient will benefit from skilled therapeutic intervention in order to improve the following deficits and impairments:  Abnormal gait,Decreased activity tolerance,Decreased balance,Decreased endurance,Decreased range of motion,Decreased strength,Difficulty walking,Increased edema,Impaired flexibility,Pain  Visit Diagnosis: Difficulty walking  Muscle weakness (generalized)  Stiffness of right hip, not elsewhere classified  Pain in right hip     Problem List Patient Active Problem List   Diagnosis Date Noted  . Lisfranc dislocation, left, sequela   . Status post total replacement of right hip 09/04/2019  . Unilateral primary osteoarthritis, right hip 09/03/2019  . 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 Pelvic floor dysfunction in female 07/17/2019  . Chronic diarrhea 05/27/2019  . Irritable bowel syndrome with diarrhea 05/27/2019  . Bile salt-induced diarrhea 05/27/2019  . Bloating symptom 05/27/2019  . History of cholecystectomy 05/27/2019  . History of repair of hiatal hernia 05/27/2019  . History of fundoplication 38/18/2993  . Sensorineural hearing loss (SNHL) of both ears 03/25/2019  . IFG (impaired fasting glucose) 02/04/2019  . Lumbar degenerative disc disease 07/22/2017  . Lumbar spondylosis 07/22/2017  . Epigastric pain 07/04/2017  . Age related osteoporosis 11/24/2015  . Gastroesophageal reflux disease without esophagitis 11/24/2015  . Mixed hyperlipidemia 11/24/2015  . Vitamin D  deficiency 11/24/2015  . OA (osteoarthritis) of hip 01/18/2014  . Varicose veins of lower extremities with other complications 71/69/6789    Farley Ly PT, MPT 04/08/2020, 9:34 AM  [facility] Physical Therapy [address] [place], [place], [address] Phone: [phone]   Fax:  [phone]  Name: Dominique Hawkins MRN: [id number] Date of Birth: 12/16/1940

## 2020-04-12 ENCOUNTER — Encounter: Payer: Medicare Other | Admitting: Physical Therapy

## 2020-04-15 ENCOUNTER — Encounter: Payer: Medicare Other | Admitting: Rehabilitative and Restorative Service Providers"

## 2020-04-20 ENCOUNTER — Other Ambulatory Visit: Payer: Self-pay

## 2020-04-20 ENCOUNTER — Encounter: Payer: Self-pay | Admitting: Physical Therapy

## 2020-04-20 ENCOUNTER — Ambulatory Visit (INDEPENDENT_AMBULATORY_CARE_PROVIDER_SITE_OTHER): Payer: Medicare Other | Admitting: Physical Therapy

## 2020-04-20 DIAGNOSIS — M6281 Muscle weakness (generalized): Secondary | ICD-10-CM

## 2020-04-20 DIAGNOSIS — R262 Difficulty in walking, not elsewhere classified: Secondary | ICD-10-CM

## 2020-04-20 DIAGNOSIS — M25651 Stiffness of right hip, not elsewhere classified: Secondary | ICD-10-CM

## 2020-04-20 DIAGNOSIS — R279 Unspecified lack of coordination: Secondary | ICD-10-CM

## 2020-04-20 DIAGNOSIS — R159 Full incontinence of feces: Secondary | ICD-10-CM

## 2020-04-20 DIAGNOSIS — M25551 Pain in right hip: Secondary | ICD-10-CM

## 2020-04-20 NOTE — Therapy (Signed)
[place], [place], [address] Phone: [phone]   Fax:  [phone]  Physical Therapy Treatment  Patient Details  Name: Dominique Hawkins MRN: [id number] Date of Birth: 01/14/1941 Referring Provider (PT): Mcarthur Rossetti MD   Encounter Date: 04/20/2020   PT End of Session - 04/20/20 0911    Visit Number 4    Number of Visits 16    Date for PT Re-Evaluation 05/20/20    PT Start Time 0845    PT Stop Time 0925    PT Time Calculation (min) 40 min    Activity Tolerance Patient tolerated treatment well;No increased pain    Behavior During Therapy WFL for tasks assessed/performed           Past Medical History:  Diagnosis Date  . Arthritis   . Cancer ([place])    skin cancer on both legs  . Complication of anesthesia   . Difficulty sleeping    DUE TO PAIN  . GERD (gastroesophageal reflux disease)   . History of blood transfusion   . Hyperlipidemia   . IBS (irritable bowel syndrome)    diarrhea  . Osteoporosis   . Peripheral vascular disease ([place])   . PONV (postoperative nausea and vomiting)   . Prediabetes   . Venous hypertension    varicose veins    Past Surgical History:  Procedure Laterality Date  . ABDOMINAL HYSTERECTOMY    . BLADDER SUSPENSION  2008  . BREAST ENHANCEMENT SURGERY    . BTL    . CHOLECYSTECTOMY    . COLONOSCOPY    . ENDOVENOUS ABLATION SAPHENOUS VEIN W/ LASER  02-08-2011  right greater saphenous vein    and sclerotherapy  right and left legs  . HERNIA REPAIR     hiatal  . ORIF TOE FRACTURE Left 01/06/2020   Procedure: LEFT FOOT FUSION BASE 1ST METATARSAL AND OPEN REDUCTION INTERNAL FIXATION 2ND METATARSAL;  Surgeon: Newt Minion, MD;  Location: [place];  Service: Orthopedics;  Laterality: Left;  . SHOULDER ARTHROSCOPY Left   . TONSILLECTOMY    . TOTAL HIP ARTHROPLASTY Left 01/18/2014   Procedure: LEFT TOTAL HIP ARTHROPLASTY ANTERIOR APPROACH;  Surgeon: Gearlean Alf, MD;   Location: [facility];  Service: Orthopedics;  Laterality: Left;  . TOTAL HIP ARTHROPLASTY Right 09/04/2019   Procedure: RIGHT TOTAL HIP ARTHROPLASTY ANTERIOR APPROACH;  Surgeon: Mcarthur Rossetti, MD;  Location: [facility];  Service: Orthopedics;  Laterality: Right;    There were no vitals filed for this visit.   Subjective Assessment - 04/20/20 0900    Subjective Pt arriving reporting 2/10 pain in R hip. Pt feels she had made improvments since beginning therapy.    Pertinent History Previously noted R foot surgery, R THA (08/2019) and L THA (2015).    Limitations Sitting;Standing;Walking    How long can you stand comfortably? 10 minutes?    How long can you walk comfortably? < 10 minutes    Patient Stated Goals Sleep, sit, stand and walk without pain.    Currently in Pain? Yes    Pain Score 2     Pain Location Hip    Pain Orientation Right    Pain Descriptors / Indicators Aching;Sore    Pain Type Chronic pain    Pain Onset More than a month ago                             [facility]  Adult PT Treatment/Exercise - 04/20/20 0001      Exercises   Exercises Knee/Hip      Knee/Hip Exercises: Stretches   Hip Flexor Stretch Both;5 reps;20 seconds   Single knee to chest with other leg straight   Piriformis Stretch Both;5 reps;20 seconds;Other (comment)    Piriformis Stretch Limitations Figure 4 stretch    Other Knee/Hip Stretches SLTC x 2 holding 30 seconds      Knee/Hip Exercises: Machines for Strengthening   Cybex Leg Press Double leg 100# 10X slow eccentrics and single leg 50# 2 sets of 10 slow eccentrics      Knee/Hip Exercises: Standing   Other Standing Knee Exercises Alternating hip hike 2 sets of 10 3 seconds      Knee/Hip Exercises: Supine   Bridges Strengthening;Both;2 sets;10 reps   5 second hold     Knee/Hip Exercises: Sidelying   Clams 2x10      Manual Therapy   Manual Therapy Soft tissue mobilization    Manual therapy comments 10 minutes    Soft tissue  mobilization R IT band: IASTM gentle                  PT Education - 04/20/20 0910    Education Details Discussed possibility of Dry Needling    Person(s) Educated Patient    Methods Explanation    Comprehension Verbalized understanding               PT Long Term Goals - 04/20/20 0912      PT LONG TERM GOAL #1   Title Improve FOTO to 61.    Status On-going      PT LONG TERM GOAL #2   Title Improve B hip AROM for flexion to 100 degrees and IR to 0 degrees.    Status On-going      PT LONG TERM GOAL #3   Title Improve hip flexors and abductors strength to 5/5 MMT.    Status On-going      PT LONG TERM GOAL #4   Title Aniiya will be independent with her long-term HEP at [place].    Status On-going                 Plan - 04/20/20 1221    Clinical Impression Statement Pt reporting compliance with her HEP. Pt arriving with 2/10 pain in her L sided low back and R hip today. Pt with excellent response to IASTM and percussion at beginning of treatment followed by strengtheing and trunk extension exercises. Pt reporting less tightness at end of session. Continue skilled PT to progress toward LTG's set.    Personal Factors and Comorbidities Comorbidity 1    Comorbidities B THA, 12/2019 foot surgery (fracture).    Examination-Activity Limitations Sleep;Sit;Squat;Locomotion Level;Stairs;Stand    Examination-Participation Restrictions Community Activity;Driving    Stability/Clinical Decision Making Stable/Uncomplicated    Rehab Potential Good    PT Frequency 2x / week    PT Duration 8 weeks    PT Treatment/Interventions ADLs/Self Care Home Management;Cryotherapy;Therapeutic activities;Stair training;Gait training;Therapeutic exercise;Balance training;Neuromuscular re-education;Patient/family education;Manual techniques;Dry needling    PT Next Visit Plan Continue hip stretching.  Possibly progress general and hip strength and balance.    PT Home Exercise Plan Access Code:  [email or web address]    Consulted and Agree with Plan of Care Patient           Patient will benefit from skilled therapeutic intervention in order to improve the following deficits and impairments:  Abnormal gait,Decreased activity  tolerance,Decreased balance,Decreased endurance,Decreased range of motion,Decreased strength,Difficulty walking,Increased edema,Impaired flexibility,Pain  Visit Diagnosis: Difficulty walking  Muscle weakness (generalized)  Stiffness of right hip, not elsewhere classified  Unspecified lack of coordination  Incontinence of feces, unspecified fecal incontinence type  Pain in right hip     Problem List Patient Active Problem List   Diagnosis Date Noted  . Lisfranc dislocation, left, sequela   . Status post total replacement of right hip 09/04/2019  . Unilateral primary osteoarthritis, right hip 09/03/2019  . 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 Pelvic floor dysfunction in female 07/17/2019  . Chronic diarrhea 05/27/2019  . Irritable bowel syndrome with diarrhea 05/27/2019  . Bile salt-induced diarrhea 05/27/2019  . Bloating symptom 05/27/2019  . History of cholecystectomy 05/27/2019  . History of repair of hiatal hernia 05/27/2019  . History of fundoplication 36/62/9476  . Sensorineural hearing loss (SNHL) of both ears 03/25/2019  . IFG (impaired fasting glucose) 02/04/2019  . Lumbar degenerative disc disease 07/22/2017  . Lumbar spondylosis 07/22/2017  . Epigastric pain 07/04/2017  . Age related osteoporosis 11/24/2015  . Gastroesophageal reflux disease without esophagitis 11/24/2015  . Mixed hyperlipidemia 11/24/2015  . Vitamin D deficiency 11/24/2015  . OA (osteoarthritis) of hip 01/18/2014  . Varicose veins of lower extremities with other complications 54/65/0354    Oretha Caprice, PT, MPT 04/20/2020, 12:24 PM  [facility] Physical Therapy [address] [place], [place], [address] Phone: [phone]    Fax:  [phone]  Name: GABRELLA STROH MRN: [id number] Date of Birth: 1940-06-18

## 2020-04-22 ENCOUNTER — Ambulatory Visit: Payer: Medicare Other | Admitting: Rehabilitative and Restorative Service Providers"

## 2020-04-22 ENCOUNTER — Encounter: Payer: Self-pay | Admitting: Rehabilitative and Restorative Service Providers"

## 2020-04-22 ENCOUNTER — Other Ambulatory Visit: Payer: Self-pay

## 2020-04-22 DIAGNOSIS — M6281 Muscle weakness (generalized): Secondary | ICD-10-CM

## 2020-04-22 DIAGNOSIS — M25651 Stiffness of right hip, not elsewhere classified: Secondary | ICD-10-CM

## 2020-04-22 DIAGNOSIS — M25551 Pain in right hip: Secondary | ICD-10-CM

## 2020-04-22 DIAGNOSIS — R262 Difficulty in walking, not elsewhere classified: Secondary | ICD-10-CM

## 2020-04-22 NOTE — Patient Instructions (Signed)
Access Code: [email or web address] URL: [email or web address] Date: 04/22/2020 Prepared by: Vista Mink  Exercises Single Knee to Chest Stretch - 2-3 x daily - 7 x weekly - 1 sets - 5 reps - 20 seconds hold Supine Figure 4 Piriformis Stretch - 2-3 x daily - 7 x weekly - 1 sets - 5 reps - 20 seconds hold Standing Hip Hiking - 2-3 x daily - 7 x weekly - 1-2 sets - 10 reps - 3 seconds hold Tandem Stance - 1 x daily - 3 x weekly - 1 sets - 4 reps - 20 second hold Supine Gluteus Stretch - 2-3 x daily - 7 x weekly - 1 sets - 5 reps - 20 seconds hold Sidelying Hip Abduction - 1 x daily - 3 x weekly - 2 sets - 10 reps - 3 seconds hold

## 2020-04-22 NOTE — Therapy (Signed)
[place], [place], [address] Phone: [phone]   Fax:  [phone]  Physical Therapy Treatment  Patient Details  Name: Dominique Hawkins MRN: [id number] Date of Birth: 1940-06-13 Referring Provider (PT): Mcarthur Rossetti MD   Encounter Date: 04/22/2020   PT End of Session - 04/22/20 0957    Visit Number 5    Number of Visits 16    Date for PT Re-Evaluation 05/20/20    PT Start Time 0848    PT Stop Time 0942    PT Time Calculation (min) 54 min    Activity Tolerance Patient tolerated treatment well;No increased pain    Behavior During Therapy WFL for tasks assessed/performed           Past Medical History:  Diagnosis Date  . Arthritis   . Cancer ([place])    skin cancer on both legs  . Complication of anesthesia   . Difficulty sleeping    DUE TO PAIN  . GERD (gastroesophageal reflux disease)   . History of blood transfusion   . Hyperlipidemia   . IBS (irritable bowel syndrome)    diarrhea  . Osteoporosis   . Peripheral vascular disease ([place])   . PONV (postoperative nausea and vomiting)   . Prediabetes   . Venous hypertension    varicose veins    Past Surgical History:  Procedure Laterality Date  . ABDOMINAL HYSTERECTOMY    . BLADDER SUSPENSION  2008  . BREAST ENHANCEMENT SURGERY    . BTL    . CHOLECYSTECTOMY    . COLONOSCOPY    . ENDOVENOUS ABLATION SAPHENOUS VEIN W/ LASER  02-08-2011  right greater saphenous vein    and sclerotherapy  right and left legs  . HERNIA REPAIR     hiatal  . ORIF TOE FRACTURE Left 01/06/2020   Procedure: LEFT FOOT FUSION BASE 1ST METATARSAL AND OPEN REDUCTION INTERNAL FIXATION 2ND METATARSAL;  Surgeon: Newt Minion, MD;  Location: [facility];  Service: Orthopedics;  Laterality: Left;  . SHOULDER ARTHROSCOPY Left   . TONSILLECTOMY    . TOTAL HIP ARTHROPLASTY Left 01/18/2014   Procedure: LEFT TOTAL HIP ARTHROPLASTY ANTERIOR APPROACH;  Surgeon: Gearlean Alf, MD;   [facility];  Service: Orthopedics;  Laterality: Left;  . TOTAL HIP ARTHROPLASTY Right 09/04/2019   Procedure: RIGHT TOTAL HIP ARTHROPLASTY ANTERIOR APPROACH;  Surgeon: Mcarthur Rossetti, MD;  Location[facility];  Service: Orthopedics;  Laterality: Right;    There were no vitals filed for this visit.   Subjective Assessment - 04/22/20 0858    Subjective Kelina notes significantly better hip flexion AROM and pain since starting PT.    Pertinent History Previously noted R foot surgery, R THA (08/2019) and L THA (2015).    Limitations Sitting;Standing;Walking    How long can you stand comfortably? 10 minutes?    How long can you walk comfortably? < 10 minutes    Patient Stated Goals Sleep, sit, stand and walk without pain.    Currently in Pain? Yes    Pain Score 2     Pain Location Hip    Pain Orientation Right    Pain Descriptors / Indicators Aching;Sore    Pain Type Chronic pain    Pain Onset More than a month ago    Pain Frequency Intermittent    Aggravating Factors  Change of position from prolonged sitting or supine to stand (start-up stiffness)    Pain Relieving Factors Moving  Effect of Pain on Daily Activities No longer needs a cane, better hip AROM, weakness and start-up stiffness persist    Multiple Pain Sites No              [facility] PT Assessment - 04/22/20 0001      ROM / Strength   AROM / PROM / Strength AROM;Strength      AROM   Overall AROM  Deficits    AROM Assessment Site Hip    Right/Left Hip Left;Right    Right Hip Flexion 90    Right Hip External Rotation  30    Right Hip Internal Rotation  5    Left Hip Flexion 100    Left Hip External Rotation  38    Left Hip Internal Rotation  18      Strength   Overall Strength Deficits    Strength Assessment Site Hip    Right/Left Hip Left;Right    Right Hip Flexion 4+/5    Right Hip ABduction 4+/5    Left Hip Flexion 5/5    Left Hip ABduction 5/5      Flexibility   Hamstrings 45 degrees L/45 degrees  R                         [facility] Adult PT Treatment/Exercise - 04/22/20 0001      Neuro Re-ed    Neuro Re-ed Details  Tandem balance eyes open/head turning/eyes closed 2X each for 20 seconds      Exercises   Exercises Knee/Hip      Knee/Hip Exercises: Stretches   Hip Flexor Stretch Both;5 reps;20 seconds   Single knee to chest with other leg straight   Piriformis Stretch Both;5 reps;20 seconds;Other (comment)    Piriformis Stretch Limitations Figure 4 stretch    Other Knee/Hip Stretches Gluteal stretch (knee to opposite shoulder) 5X 20 seconds      Knee/Hip Exercises: Machines for Strengthening   Cybex Leg Press --      Knee/Hip Exercises: Standing   Other Standing Knee Exercises Alternating hip hike 2 sets of 10 3 seconds      Knee/Hip Exercises: Supine   Bridges --   5 second hold     Knee/Hip Exercises: Sidelying   Hip ABduction Strengthening;Both;1 set;10 reps    Hip ABduction Limitations 3 second hold 1/4 turn to stomach                  PT Education - 04/22/20 0955    Education Details Updated HEP, reviewed HEP, reviewed exam findings and PT recommendations for continued care.    Person(s) Educated Patient    Methods Explanation;Demonstration;Tactile cues;Verbal cues;Handout    Comprehension Verbal cues required;Need further instruction;Returned demonstration;Verbalized understanding;Tactile cues required               PT Long Term Goals - 04/22/20 0955      PT LONG TERM GOAL #1   Title Improve FOTO to 61.    Baseline 54 (was 44 at evaluation)    Time 4    Period Weeks    Status On-going    Target Date 05/27/20      PT LONG TERM GOAL #2   Title Improve B hip AROM for flexion to 100 degrees and IR to 0 degrees.    Status Achieved      PT LONG TERM GOAL #3   Title Improve hip flexors and abductors strength to 5/5 MMT.  Baseline Improved from 4-/5 to 4+/5 MMT    Time 4    Period Weeks    Status On-going    Target Date  05/27/20      PT LONG TERM GOAL #4   Title Belky will be independent with her long-term HEP at [place].    Time 4    Period Weeks    Status On-going    Target Date 05/27/20                 Plan - 04/22/20 0958    Clinical Impression Statement Rocio is making subjective, objective and functional progress with her physical therapy in only 5 visits.  She no longer uses a cane, has improved her objective hip AROM and strength, rates herself more functional on the FOTO functional scale and rates her hip pain as significantly lower than a month ago.  She can still get 8/10 hip pain on occasion and start-up stiffness is limiting function.  I anticipate continued gains towards meeting LTGs with continued 1X/week supervised PT with DC to independent PT in a month.    Personal Factors and Comorbidities Comorbidity 1    Comorbidities B THA, 12/2019 foot surgery (fracture).    Examination-Activity Limitations Sleep;Sit;Squat;Locomotion Level;Stairs;Stand    Examination-Participation Restrictions Community Activity;Driving    Stability/Clinical Decision Making Stable/Uncomplicated    Rehab Potential Good    PT Frequency 2x / week    PT Duration 8 weeks    PT Treatment/Interventions ADLs/Self Care Home Management;Cryotherapy;Therapeutic activities;Stair training;Gait training;Therapeutic exercise;Balance training;Neuromuscular re-education;Patient/family education;Manual techniques;Dry needling    PT Next Visit Plan Continue hip stretching.  Possibly progress general and hip strength and balance.    PT Home Exercise Plan Access Code: [email or web address]    Consulted and Agree with Plan of Care Patient           Patient will benefit from skilled therapeutic intervention in order to improve the following deficits and impairments:  Abnormal gait,Decreased activity tolerance,Decreased balance,Decreased endurance,Decreased range of motion,Decreased strength,Difficulty walking,Increased edema,Impaired  flexibility,Pain  Visit Diagnosis: Difficulty walking  Muscle weakness (generalized)  Stiffness of right hip, not elsewhere classified  Pain in right hip     Problem List Patient Active Problem List   Diagnosis Date Noted  . Lisfranc dislocation, left, sequela   . Status post total replacement of right hip 09/04/2019  . Unilateral primary osteoarthritis, right hip 09/03/2019  . 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 Pelvic floor dysfunction in female 07/17/2019  . Chronic diarrhea 05/27/2019  . Irritable bowel syndrome with diarrhea 05/27/2019  . Bile salt-induced diarrhea 05/27/2019  . Bloating symptom 05/27/2019  . History of cholecystectomy 05/27/2019  . History of repair of hiatal hernia 05/27/2019  . History of fundoplication 33/29/5188  . Sensorineural hearing loss (SNHL) of both ears 03/25/2019  . IFG (impaired fasting glucose) 02/04/2019  . Lumbar degenerative disc disease 07/22/2017  . Lumbar spondylosis 07/22/2017  . Epigastric pain 07/04/2017  . Age related osteoporosis 11/24/2015  . Gastroesophageal reflux disease without esophagitis 11/24/2015  . Mixed hyperlipidemia 11/24/2015  . Vitamin D deficiency 11/24/2015  . OA (osteoarthritis) of hip 01/18/2014  . Varicose veins of lower extremities with other complications 41/66/0630    Farley Ly PT, MPT 04/22/2020, 10:01 AM  [facility] [address] [place], [place], [address] Phone: [phone]   Fax:  [phone]  Name: JASMANE BROCKWAY MRN: [id number] Date of Birth: Feb 19, 1941

## 2020-04-25 ENCOUNTER — Ambulatory Visit: Payer: Medicare Other | Admitting: Orthopedic Surgery

## 2020-04-28 ENCOUNTER — Ambulatory Visit (INDEPENDENT_AMBULATORY_CARE_PROVIDER_SITE_OTHER): Payer: Medicare Other | Admitting: Orthopedic Surgery

## 2020-04-28 ENCOUNTER — Encounter: Payer: Self-pay | Admitting: Orthopedic Surgery

## 2020-04-28 VITALS — Ht 62.0 in | Wt 150.0 lb

## 2020-04-28 DIAGNOSIS — M79672 Pain in left foot: Secondary | ICD-10-CM

## 2020-05-03 ENCOUNTER — Ambulatory Visit: Payer: Medicare Other | Admitting: Physical Therapy

## 2020-05-03 ENCOUNTER — Other Ambulatory Visit: Payer: Self-pay

## 2020-05-03 DIAGNOSIS — M25651 Stiffness of right hip, not elsewhere classified: Secondary | ICD-10-CM | POA: Diagnosis not present

## 2020-05-03 DIAGNOSIS — R262 Difficulty in walking, not elsewhere classified: Secondary | ICD-10-CM | POA: Diagnosis not present

## 2020-05-03 DIAGNOSIS — R279 Unspecified lack of coordination: Secondary | ICD-10-CM

## 2020-05-03 DIAGNOSIS — M6281 Muscle weakness (generalized): Secondary | ICD-10-CM | POA: Diagnosis not present

## 2020-05-03 DIAGNOSIS — R159 Full incontinence of feces: Secondary | ICD-10-CM

## 2020-05-03 DIAGNOSIS — M25551 Pain in right hip: Secondary | ICD-10-CM

## 2020-05-03 NOTE — Therapy (Signed)
[place], [place], [address] Phone: [phone]   Fax:  [phone]  Physical Therapy Treatment  Patient Details  Name: Dominique Hawkins MRN: [id number] Date of Birth: [birth year] Referring Provider (PT): Mcarthur Rossetti MD   Encounter Date: 05/03/2020   PT End of Session - 05/03/20 1014    Visit Number 6    Number of Visits 16    Date for PT Re-Evaluation 05/20/20    PT Start Time 0945    PT Stop Time 1017    PT Time Calculation (min) 30 min    Activity Tolerance Patient tolerated treatment well;No increased pain    Behavior During Therapy WFL for tasks assessed/performed           Past Medical History:  Diagnosis Date  . Arthritis   . Cancer ([place])    skin cancer on both legs  . Complication of anesthesia   . Difficulty sleeping    DUE TO PAIN  . GERD (gastroesophageal reflux disease)   . History of blood transfusion   . Hyperlipidemia   . IBS (irritable bowel syndrome)    diarrhea  . Osteoporosis   . Peripheral vascular disease ([place])   . PONV (postoperative nausea and vomiting)   . Prediabetes   . Venous hypertension    varicose veins    Past Surgical History:  Procedure Laterality Date  . ABDOMINAL HYSTERECTOMY    . BLADDER SUSPENSION  2008  . BREAST ENHANCEMENT SURGERY    . BTL    . CHOLECYSTECTOMY    . COLONOSCOPY    . ENDOVENOUS ABLATION SAPHENOUS VEIN W/ LASER  02-08-2011  right greater saphenous vein    and sclerotherapy  right and left legs  . HERNIA REPAIR     hiatal  . ORIF TOE FRACTURE Left 01/06/2020   Procedure: LEFT FOOT FUSION BASE 1ST METATARSAL AND OPEN REDUCTION INTERNAL FIXATION 2ND METATARSAL;  Surgeon: Newt Minion, MD;  Location: [facility];  Service: Orthopedics;  Laterality: Left;  . SHOULDER ARTHROSCOPY Left   . TONSILLECTOMY    . TOTAL HIP ARTHROPLASTY Left 01/18/2014   Procedure: LEFT TOTAL HIP ARTHROPLASTY ANTERIOR APPROACH;  Surgeon: Gearlean Alf, MD;  Location:  [facility];  Service: Orthopedics;  Laterality: Left;  . TOTAL HIP ARTHROPLASTY Right 09/04/2019   Procedure: RIGHT TOTAL HIP ARTHROPLASTY ANTERIOR APPROACH;  Surgeon: Mcarthur Rossetti, MD;  [facility];  Service: Orthopedics;  Laterality: Right;    There were no vitals filed for this visit.   Subjective Assessment - 05/03/20 1106    Subjective Neveen arriving today almost 15 minutes late. Pt reporting no significant improvements in her pain with 8/10 R hip pain.    Pertinent History Previously noted R foot surgery, R THA (08/2019) and L THA (2015).    Limitations Sitting;Standing;Walking    How long can you stand comfortably? 10 minutes?    How long can you walk comfortably? < 10 minutes    Patient Stated Goals Sleep, sit, stand and walk without pain.    Currently in Pain? Yes    Pain Score 8     Pain Location Hip    Pain Orientation Right    Pain Descriptors / Indicators Aching;Tender;Sore    Pain Type Chronic pain    Pain Onset More than a month ago  Dominique Hawkins Adult PT Treatment/Exercise - 05/03/20 0001      Exercises   Exercises Knee/Hip      Knee/Hip Exercises: Supine   Other Supine Knee/Hip Exercises Isometrics: R hip flexion/ extension /adduction/ abduction all x 10 holding 5 seconds each      Manual Therapy   Manual therapy comments Hip joint mobilizations, muscle energy techniques contract/relax of bilateral hips    Soft tissue mobilization STM to R hip and IT band                       PT Long Term Goals - 05/03/20 1116      PT LONG TERM GOAL #1   Title Improve FOTO to 61.    Status On-going      PT LONG TERM GOAL #2   Title Improve B hip AROM for flexion to 100 degrees and IR to 0 degrees.    Status On-going      PT LONG TERM GOAL #3   Title Improve hip flexors and abductors strength to 5/5 MMT.    Status On-going      PT LONG TERM GOAL #4   Title Saharra will be independent with her long-term HEP at  [place].    Status On-going                 Plan - 05/03/20 1110    Clinical Impression Statement Dominique Hawkins arrived today 15 minutes late for session. Pt reporting 8/10 R hip pain and feels her pain hasn't improved over the last week. Pt did report improved function and that she no longer uses her straight cane. Pt with excellent response to R hip mobilization, isometrics, contract/relax exercises and percussion to right lumbar paraspinals and gluteals. Pt with limited tolerance to percussion over greater trochanter and lateral hip/ IT band. At end of session pt reporting only 3/10 pain in her R hip and reported less stiffness and tolerance to touch. I recommended that pt bump up her therapy visits back to 2 times each week until her pain is more controlled. Conitnue skilled PT progressing toward LTG's set.    Personal Factors and Comorbidities Comorbidity 1    Comorbidities B THA, 12/2019 foot surgery (fracture).    Examination-Activity Limitations Sleep;Sit;Squat;Locomotion Level;Stairs;Stand    Examination-Participation Restrictions Community Activity;Driving    Stability/Clinical Decision Making Stable/Uncomplicated    Rehab Potential Good    PT Frequency 2x / week    PT Duration 8 weeks    PT Treatment/Interventions ADLs/Self Care Home Management;Cryotherapy;Therapeutic activities;Stair training;Gait training;Therapeutic exercise;Balance training;Neuromuscular re-education;Patient/family education;Manual techniques;Dry needling    PT Next Visit Plan Continue hip stretching.  Possibly progress general and hip strength and balance.    PT Home Exercise Plan Access Code: [email or web address]    Consulted and Agree with Plan of Care Patient           Patient will benefit from skilled therapeutic intervention in order to improve the following deficits and impairments:  Abnormal gait,Decreased activity tolerance,Decreased balance,Decreased endurance,Decreased range of motion,Decreased strength,Difficulty  walking,Increased edema,Impaired flexibility,Pain  Visit Diagnosis: Difficulty walking  Muscle weakness (generalized)  Stiffness of right hip, not elsewhere classified  Pain in right hip  Unspecified lack of coordination  Incontinence of feces, unspecified fecal incontinence type     Problem List Patient Active Problem List   Diagnosis Date Noted  . Lisfranc dislocation, left, sequela   . Status post total replacement of right hip 09/04/2019  . Unilateral primary osteoarthritis, right hip 09/03/2019  .  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 Pelvic floor dysfunction in female 07/17/2019  . Chronic diarrhea 05/27/2019  . Irritable bowel syndrome with diarrhea 05/27/2019  . Bile salt-induced diarrhea 05/27/2019  . Bloating symptom 05/27/2019  . History of cholecystectomy 05/27/2019  . History of repair of hiatal hernia 05/27/2019  . History of fundoplication 58/85/0277  . Sensorineural hearing loss (SNHL) of both ears 03/25/2019  . IFG (impaired fasting glucose) 02/04/2019  . Lumbar degenerative disc disease 07/22/2017  . Lumbar spondylosis 07/22/2017  . Epigastric pain 07/04/2017  . Age related osteoporosis 11/24/2015  . Gastroesophageal reflux disease without esophagitis 11/24/2015  . Mixed hyperlipidemia 11/24/2015  . Vitamin D deficiency 11/24/2015  . OA (osteoarthritis) of hip 01/18/2014  . Varicose veins of lower extremities with other complications 41/28/7867    Oretha Caprice, PT, MPT  05/03/2020, 11:19 AM  [facility] [address] [place], [place], [address] Phone: [phone]   Fax:  [phone]  Name: AFTYN NOTT MRN: [id number] Date of Birth: [birth year]

## 2020-05-04 ENCOUNTER — Encounter: Payer: Medicare Other | Admitting: Rehabilitative and Restorative Service Providers"

## 2020-05-05 ENCOUNTER — Encounter: Payer: Self-pay | Admitting: Orthopedic Surgery

## 2020-05-05 NOTE — Progress Notes (Signed)
Office Visit Note   Patient: Dominique Hawkins           Date of Birth: 1940-12-27           MRN: [id number] Visit Date: 04/28/2020              Requested by: No referring provider defined for this encounter. PCP: Jeannine Boga (Inactive)  Chief Complaint  Patient presents with  . Left Foot - Follow-up    01/06/20- Left foot fusion base of 1st MT and ORIF 2nd metatarsal      HPI: Patient is a 80 year old woman who presents about 3-1/2 months status post fusion base of the first metatarsal with internal fixation of the second metatarsal.  Patient states she still has some pain and swelling she is wearing compression stockings.  Assessment & Plan: Visit Diagnoses:  1. Pain in left foot     Plan: Continue with compression stockings continue with a stiff soled shoe to unload the midfoot.  She will work on toe raises for fascial strengthening.  Follow-Up Instructions: Return if symptoms worsen or fail to improve.   Ortho Exam  Patient is alert, oriented, no adenopathy, well-dressed, normal affect, normal respiratory effort. Examination patient has good pulses there is some swelling with venous stasis insufficiency there is good dorsiflexion of the ankle.  The incision is well-healed.  Imaging: No results found. No images are attached to the encounter.  Labs: No results found for: HGBA1C, ESRSEDRATE, CRP, LABURIC, REPTSTATUS, GRAMSTAIN, CULT, LABORGA   Lab Results  Component Value Date   ALBUMIN 3.9 01/14/2014    No results found for: MG No results found for: VD25OH  No results found for: PREALBUMIN CBC EXTENDED Latest Ref Rng & Units 01/06/2020 09/05/2019 08/31/2019  WBC 4.0 - 10.5 K/uL 3.5(L) 9.2 4.5  RBC 3.87 - 5.11 MIL/uL 4.52 3.47(L) 4.59  HGB 12.0 - 15.0 g/dL 11.2(L) 10.1(L) 13.1  HCT 36.0 - 46.0 % 36.6 29.0(L) 40.3  PLT 150 - 400 K/uL 179 159 181     Body mass index is 27.44 kg/m.  Orders:  No orders of the defined types were placed in this  encounter.  No orders of the defined types were placed in this encounter.    Procedures: No procedures performed  Clinical Data: No additional findings.  ROS:  All other systems negative, except as noted in the HPI. Review of Systems  Objective: Vital Signs: Ht 5\' 2"  (1.575 m)   Wt 150 lb (68 kg)   BMI 27.44 kg/m   Specialty Comments:  No specialty comments available.  PMFS History: Patient Active Problem List   Diagnosis Date Noted  . Lisfranc dislocation, left, sequela   . Status post total replacement of right hip 09/04/2019  . Unilateral primary osteoarthritis, right hip 09/03/2019  . 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 Pelvic floor dysfunction in female 07/17/2019  . Chronic diarrhea 05/27/2019  . Irritable bowel syndrome with diarrhea 05/27/2019  . Bile salt-induced diarrhea 05/27/2019  . Bloating symptom 05/27/2019  . History of cholecystectomy 05/27/2019  . History of repair of hiatal hernia 05/27/2019  . History of fundoplication 61/44/3154  . Sensorineural hearing loss (SNHL) of both ears 03/25/2019  . IFG (impaired fasting glucose) 02/04/2019  . Lumbar degenerative disc disease 07/22/2017  . Lumbar spondylosis 07/22/2017  . Epigastric pain 07/04/2017  . Age related osteoporosis 11/24/2015  . Gastroesophageal reflux disease without esophagitis 11/24/2015  . Mixed hyperlipidemia 11/24/2015  . Vitamin D deficiency  11/24/2015  . OA (osteoarthritis) of hip 01/18/2014  . Varicose veins of lower extremities with other complications 27/09/8673   Past Medical History:  Diagnosis Date  . Arthritis   . Cancer ([place])    skin cancer on both legs  . Complication of anesthesia   . Difficulty sleeping    DUE TO PAIN  . GERD (gastroesophageal reflux disease)   . History of blood transfusion   . Hyperlipidemia   . IBS (irritable bowel syndrome)    diarrhea  . Osteoporosis   . Peripheral vascular disease ([place])   . PONV (postoperative nausea  and vomiting)   . Prediabetes   . Venous hypertension    varicose veins    Family History  Problem Relation Age of Onset  . Mental illness Mother   . Cancer Mother   . Diabetes Father   . Cancer Father   . Diabetes Brother   . Colon cancer Neg Hx   . Esophageal cancer Neg Hx   . Inflammatory bowel disease Neg Hx   . Liver disease Neg Hx   . Pancreatic cancer Neg Hx   . Rectal cancer Neg Hx   . Stomach cancer Neg Hx     Past Surgical History:  Procedure Laterality Date  . ABDOMINAL HYSTERECTOMY    . BLADDER SUSPENSION  2008  . BREAST ENHANCEMENT SURGERY    . BTL    . CHOLECYSTECTOMY    . COLONOSCOPY    . ENDOVENOUS ABLATION SAPHENOUS VEIN W/ LASER  02-08-2011  right greater saphenous vein    and sclerotherapy  right and left legs  . HERNIA REPAIR     hiatal  . ORIF TOE FRACTURE Left 01/06/2020   Procedure: LEFT FOOT FUSION BASE 1ST METATARSAL AND OPEN REDUCTION INTERNAL FIXATION 2ND METATARSAL;  Surgeon: Newt Minion, MD;  Location: [facility];  Service: Orthopedics;  Laterality: Left;  . SHOULDER ARTHROSCOPY Left   . TONSILLECTOMY    . TOTAL HIP ARTHROPLASTY Left 01/18/2014   Procedure: LEFT TOTAL HIP ARTHROPLASTY ANTERIOR APPROACH;  Surgeon: Gearlean Alf, MD;  [facility];  Service: Orthopedics;  Laterality: Left;  . TOTAL HIP ARTHROPLASTY Right 09/04/2019   Procedure: RIGHT TOTAL HIP ARTHROPLASTY ANTERIOR APPROACH;  Surgeon: Mcarthur Rossetti, MD;  Location[facility];  Service: Orthopedics;  Laterality: Right;   Social History   Occupational History  . Occupation: accounting  Tobacco Use  . Smoking status: Never Smoker  . Smokeless tobacco: Never Used  Vaping Use  . Vaping Use: Never used  Substance and Sexual Activity  . Alcohol use: Yes    Comment: OCCASIONAL  . Drug use: No  . Sexual activity: Never

## 2020-05-12 ENCOUNTER — Ambulatory Visit: Payer: Self-pay

## 2020-05-12 ENCOUNTER — Encounter: Payer: Self-pay | Admitting: Rehabilitative and Restorative Service Providers"

## 2020-05-12 ENCOUNTER — Ambulatory Visit (INDEPENDENT_AMBULATORY_CARE_PROVIDER_SITE_OTHER): Payer: Medicare Other | Admitting: Orthopaedic Surgery

## 2020-05-12 ENCOUNTER — Encounter: Payer: Self-pay | Admitting: Orthopaedic Surgery

## 2020-05-12 ENCOUNTER — Other Ambulatory Visit: Payer: Self-pay

## 2020-05-12 ENCOUNTER — Ambulatory Visit: Payer: Medicare Other | Admitting: Rehabilitative and Restorative Service Providers"

## 2020-05-12 DIAGNOSIS — M5416 Radiculopathy, lumbar region: Secondary | ICD-10-CM | POA: Diagnosis not present

## 2020-05-12 DIAGNOSIS — R262 Difficulty in walking, not elsewhere classified: Secondary | ICD-10-CM | POA: Diagnosis not present

## 2020-05-12 DIAGNOSIS — M6281 Muscle weakness (generalized): Secondary | ICD-10-CM

## 2020-05-12 DIAGNOSIS — Z96641 Presence of right artificial hip joint: Secondary | ICD-10-CM | POA: Diagnosis not present

## 2020-05-12 DIAGNOSIS — R293 Abnormal posture: Secondary | ICD-10-CM | POA: Diagnosis not present

## 2020-05-12 NOTE — Therapy (Signed)
[facility] [place], [place], [address] Phone: [phone]   Fax:  [phone]  Physical Therapy Treatment/Reassessment  Patient Details  Name: Dominique Hawkins MRN: [id number] Date of Birth: 1940-06-09 Referring Provider (PT): Mcarthur Rossetti MD   Encounter Date: 05/12/2020   PT End of Session - 05/12/20 1609    Visit Number 7    Number of Visits 16    Date for PT Re-Evaluation 05/20/20    PT Start Time 3009    PT Stop Time 1401    PT Time Calculation (min) 46 min    Activity Tolerance Patient tolerated treatment well;No increased pain    Behavior During Therapy WFL for tasks assessed/performed           Past Medical History:  Diagnosis Date  . Arthritis   . Cancer ([place])    skin cancer on both legs  . Complication of anesthesia   . Difficulty sleeping    DUE TO PAIN  . GERD (gastroesophageal reflux disease)   . History of blood transfusion   . Hyperlipidemia   . IBS (irritable bowel syndrome)    diarrhea  . Osteoporosis   . Peripheral vascular disease ([place])   . PONV (postoperative nausea and vomiting)   . Prediabetes   . Venous hypertension    varicose veins    Past Surgical History:  Procedure Laterality Date  . ABDOMINAL HYSTERECTOMY    . BLADDER SUSPENSION  2008  . BREAST ENHANCEMENT SURGERY    . BTL    . CHOLECYSTECTOMY    . COLONOSCOPY    . ENDOVENOUS ABLATION SAPHENOUS VEIN W/ LASER  02-08-2011  right greater saphenous vein    and sclerotherapy  right and left legs  . HERNIA REPAIR     hiatal  . ORIF TOE FRACTURE Left 01/06/2020   Procedure: LEFT FOOT FUSION BASE 1ST METATARSAL AND OPEN REDUCTION INTERNAL FIXATION 2ND METATARSAL;  Surgeon: Newt Minion, MD;  Location: [facility];  Service: Orthopedics;  Laterality: Left;  . SHOULDER ARTHROSCOPY Left   . TONSILLECTOMY    . TOTAL HIP ARTHROPLASTY Left 01/18/2014   Procedure: LEFT TOTAL HIP ARTHROPLASTY ANTERIOR APPROACH;  Surgeon: Gearlean Alf,  MD;  [facility];  Service: Orthopedics;  Laterality: Left;  . TOTAL HIP ARTHROPLASTY Right 09/04/2019   Procedure: RIGHT TOTAL HIP ARTHROPLASTY ANTERIOR APPROACH;  Surgeon: Mcarthur Rossetti, MD;  [facility];  Service: Orthopedics;  Laterality: Right;    There were no vitals filed for this visit.   Subjective Assessment - 05/12/20 1604    Subjective Dominique Hawkins notes her pain has been worse over the past 2-3 weeks.  She has only attended 2 PT appointments during that time.  She has no complaints about hip AROM, flexibility or strength.    Pertinent History Previously noted R foot surgery, R THA (08/2019) and L THA (2015).    Limitations Sitting;Standing;Walking    How long can you sit comfortably? Standing from prolonged sitting and sitting without lumbar support are painful    How long can you stand comfortably? 10 minutes?    How long can you walk comfortably? < 10 minutes    Diagnostic tests Positive SLR R    Patient Stated Goals Sleep, sit, stand and walk without pain.    Currently in Pain? Yes    Pain Score 8     Pain Location Hip    Pain Orientation Right    Pain Descriptors / Indicators Burning;Sharp;Shooting  Pain Type Acute pain    Pain Radiating Towards R foot    Pain Onset 1 to 4 weeks ago    Pain Frequency Constant    Aggravating Factors  Prolonged postures, standing after prolonged sitting and sitting without lumbar support    Effect of Pain on Daily Activities Limits all ADLs due to pain    Multiple Pain Sites No              [facility] PT Assessment - 05/12/20 0001      Observation/Other Assessments   Focus on Therapeutic Outcomes (FOTO)  50, was 44 (Goal 61)      ROM / Strength   AROM / PROM / Strength AROM;Strength      AROM   Overall AROM  Deficits    AROM Assessment Site Hip    Right/Left Hip Right    Right Hip Flexion 95    Right Hip External Rotation  35    Right Hip Internal Rotation  5      Strength   Overall Strength Deficits     Strength Assessment Site Hip    Right/Left Hip Left;Right    Right Hip Flexion 4+/5    Right Hip ABduction 4+/5      Flexibility   Soft Tissue Assessment /Muscle Length yes    Hamstrings 50 degrees R                         [facility] Adult PT Treatment/Exercise - 05/12/20 0001      Neuro Re-ed    Neuro Re-ed Details  --      Exercises   Exercises Knee/Hip      Knee/Hip Exercises: Stretches   Hip Flexor Stretch Both;5 reps;20 seconds   Single knee to chest with other leg straight   Piriformis Stretch Both;5 reps;20 seconds;Other (comment)    Piriformis Stretch Limitations Figure 4 stretch    Other Knee/Hip Stretches Gluteal stretch (knee to opposite shoulder) 5X 20 seconds      Knee/Hip Exercises: Standing   Other Standing Knee Exercises Alternating hip hike 2 sets of 10 3 seconds    Other Standing Knee Exercises Trunk extension AROM 10X 3 seconds      Knee/Hip Exercises: Sidelying   Hip ABduction --    Hip ABduction Limitations --      Knee/Hip Exercises: Prone   Other Prone Exercises Prone alternating hip extensions 2 sets of 10 for 3 seconds                  PT Education - 05/12/20 1607    Education Details Reviewed exam findings and updated HEP.    Person(s) Educated Patient    Methods Explanation;Demonstration;Tactile cues;Verbal cues;Handout    Comprehension Verbal cues required;Need further instruction;Returned demonstration;Verbalized understanding;Tactile cues required               PT Long Term Goals - 05/12/20 1607      PT LONG TERM GOAL #1   Title Improve FOTO to 61.    Baseline 50 (was 44 at evaluation and 54 last visit)    Time 6    Period Weeks    Status On-going    Target Date 06/24/20      PT LONG TERM GOAL #2   Title Improve B hip AROM for flexion to 100 degrees and IR to 0 degrees.    Baseline See objective    Status Achieved  PT LONG TERM GOAL #3   Title Improve hip flexors and abductors strength to 5/5 MMT.     Baseline Improved from 4-/5 to 4+/5 MMT    Time 4    Period Weeks    Status On-going      PT LONG TERM GOAL #4   Title Dominique Hawkins will be independent with her long-term HEP at DC.    Status On-going      PT LONG TERM GOAL #5   Title Dominique Hawkins will have a negative SLR test at DC.    Baseline Positive on 05/12/2020    Time 6    Period Weeks    Status New    Target Date 06/24/20                 Plan - 05/12/20 1610    Clinical Impression Statement Dominique Hawkins has much better AROM and strength than at evaluation.  Pain improved originally with physical therapy but has become significantly worse while mostly independent over the past 3 weeks (2 PT visits in that time).  Hip testing looks good and symptoms may be related to her lumbar spine.  Straight leg raise test was positive and Dominique Hawkins reports a history of previous sciatica and lumbar OA.  I recommend adding spine stabilization activities to her current hip AROM and strength work to reduce peripheral symptoms and meet long-term goals.    Personal Factors and Comorbidities Comorbidity 1    Comorbidities B THA, 12/2019 foot surgery (fracture).    Examination-Activity Limitations Sleep;Sit;Squat;Locomotion Level;Stairs;Stand    Examination-Participation Restrictions Community Activity;Driving    Stability/Clinical Decision Making Stable/Uncomplicated    Clinical Decision Making Moderate    Rehab Potential Good    PT Frequency 2x / week    PT Duration 6 weeks    PT Treatment/Interventions ADLs/Self Care Home Management;Cryotherapy;Therapeutic activities;Stair training;Gait training;Therapeutic exercise;Balance training;Neuromuscular re-education;Patient/family education;Manual techniques;Dry needling    PT Next Visit Plan Continue hip stretching.  Possibly progress general and hip strength and balance.    PT Home Exercise Plan Access Code: [email or web address]    Recommended Other Services Lumbar screen    Consulted and Agree with Plan of Care Patient            Patient will benefit from skilled therapeutic intervention in order to improve the following deficits and impairments:  Abnormal gait,Decreased activity tolerance,Decreased balance,Decreased endurance,Decreased range of motion,Decreased strength,Difficulty walking,Increased edema,Impaired flexibility,Pain  Visit Diagnosis: Abnormal posture  Difficulty walking  Muscle weakness (generalized)  Radiculopathy, lumbar region     Problem List Patient Active Problem List   Diagnosis Date Noted  . Lisfranc dislocation, left, sequela   . Status post total replacement of right hip 09/04/2019  . Unilateral primary osteoarthritis, right hip 09/03/2019  . 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 Pelvic floor dysfunction in female 07/17/2019  . Chronic diarrhea 05/27/2019  . Irritable bowel syndrome with diarrhea 05/27/2019  . Bile salt-induced diarrhea 05/27/2019  . Bloating symptom 05/27/2019  . History of cholecystectomy 05/27/2019  . History of repair of hiatal hernia 05/27/2019  . History of fundoplication 64/40/3474  . Sensorineural hearing loss (SNHL) of both ears 03/25/2019  . IFG (impaired fasting glucose) 02/04/2019  . Lumbar degenerative disc disease 07/22/2017  . Lumbar spondylosis 07/22/2017  . Epigastric pain 07/04/2017  . Age related osteoporosis 11/24/2015  . Gastroesophageal reflux disease without esophagitis 11/24/2015  . Mixed hyperlipidemia 11/24/2015  . Vitamin D deficiency 11/24/2015  . OA (osteoarthritis) of hip 01/18/2014  . Varicose  veins of lower extremities with other complications 10/62/6948    Farley Ly PT, MPT 05/12/2020, 4:14 PM  [facility] [address] [place], [place], [address] Phone: [phone]   Fax:  [phone]  Name: Dominique Hawkins MRN: [id number] Date of Birth: 1940/04/18

## 2020-05-12 NOTE — Progress Notes (Signed)
Office Visit Note   Patient: Dominique Hawkins           Date of Birth: [birth year]           MRN: [id number] Visit Date: 05/12/2020              Requested by: No referring provider defined for this encounter. PCP: Jeannine Boga (Inactive)   Assessment & Plan: Visit Diagnoses:  1. History of right hip replacement     Plan: She will continue work with physical therapy for the sciatica-like symptoms she is experiencing on the right.  If her sciatica becomes worse she will follow-up with [place].  In regards to her hip follow-up as needed.  Questions were encouraged and answered at length by Dr. Ninfa Linden and myself.  Follow-Up Instructions: Return if symptoms worsen or fail to improve.   Orders:  Orders Placed This Encounter  Procedures  . XR HIP UNILAT W OR W/O PELVIS 1V RIGHT   No orders of the defined types were placed in this encounter.     Procedures: No procedures performed   Clinical Data: No additional findings.   Subjective: Chief Complaint  Patient presents with  . Right Hip - Follow-up    HPI Dominique Hawkins returns today 8 months status post right total hip arthroplasty.  She is overall doing well in regards to her hip.  She is having some sciatica-like symptoms down the right hip and is working with physical therapy on this.  She has some low back pain.  In regards to her right hip she has no pain in the groin and her preoperative pain is no longer present.  Review of Systems See HPI otherwise negative or noncontributory.  Objective: Vital Signs: There were no vitals taken for this visit.  Physical Exam Constitutional:      Appearance: She is not ill-appearing or diaphoretic.  Pulmonary:     Effort: Pulmonary effort is normal.  Neurological:     Mental Status: She is alert and oriented to person, place, and time.  Psychiatric:        Mood and Affect: Mood normal.     Ortho Exam Right hip excellent range of motion without pain.  Ambulates without any  assistive device. Specialty Comments:  No specialty comments available.  Imaging: XR HIP UNILAT W OR W/O PELVIS 1V RIGHT  Result Date: 05/12/2020 AP pelvis lateral view of the right hip: Right hip is well located.  No hardware failure.  Components all appear well-seated.  No acute fractures of the pelvis.    PMFS History: Patient Active Problem List   Diagnosis Date Noted  . Lisfranc dislocation, left, sequela   . Status post total replacement of right hip 09/04/2019  . Unilateral primary osteoarthritis, right hip 09/03/2019  . 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 Pelvic floor dysfunction in female 07/17/2019  . Chronic diarrhea 05/27/2019  . Irritable bowel syndrome with diarrhea 05/27/2019  . Bile salt-induced diarrhea 05/27/2019  . Bloating symptom 05/27/2019  . History of cholecystectomy 05/27/2019  . History of repair of hiatal hernia 05/27/2019  . History of fundoplication 32/67/1245  . Sensorineural hearing loss (SNHL) of both ears 03/25/2019  . IFG (impaired fasting glucose) 02/04/2019  . Lumbar degenerative disc disease 07/22/2017  . Lumbar spondylosis 07/22/2017  . Epigastric pain 07/04/2017  . Age related osteoporosis 11/24/2015  . Gastroesophageal reflux disease without esophagitis 11/24/2015  . Mixed hyperlipidemia 11/24/2015  . Vitamin D deficiency 11/24/2015  .  OA (osteoarthritis) of hip 01/18/2014  . Varicose veins of lower extremities with other complications 17/91/5056   Past Medical History:  Diagnosis Date  . Arthritis   . Cancer ([place])    skin cancer on both legs  . Complication of anesthesia   . Difficulty sleeping    DUE TO PAIN  . GERD (gastroesophageal reflux disease)   . History of blood transfusion   . Hyperlipidemia   . IBS (irritable bowel syndrome)    diarrhea  . Osteoporosis   . Peripheral vascular disease ([place])   . PONV (postoperative nausea and vomiting)   . Prediabetes   . Venous hypertension    varicose veins     Family History  Problem Relation Age of Onset  . Mental illness Mother   . Cancer Mother   . Diabetes Father   . Cancer Father   . Diabetes Brother   . Colon cancer Neg Hx   . Esophageal cancer Neg Hx   . Inflammatory bowel disease Neg Hx   . Liver disease Neg Hx   . Pancreatic cancer Neg Hx   . Rectal cancer Neg Hx   . Stomach cancer Neg Hx     Past Surgical History:  Procedure Laterality Date  . ABDOMINAL HYSTERECTOMY    . BLADDER SUSPENSION  2008  . BREAST ENHANCEMENT SURGERY    . BTL    . CHOLECYSTECTOMY    . COLONOSCOPY    . ENDOVENOUS ABLATION SAPHENOUS VEIN W/ LASER  02-08-2011  right greater saphenous vein    and sclerotherapy  right and left legs  . HERNIA REPAIR     hiatal  . ORIF TOE FRACTURE Left 01/06/2020   Procedure: LEFT FOOT FUSION BASE 1ST METATARSAL AND OPEN REDUCTION INTERNAL FIXATION 2ND METATARSAL;  Surgeon: Newt Minion, MD;  Location: [facility];  Service: Orthopedics;  Laterality: Left;  . SHOULDER ARTHROSCOPY Left   . TONSILLECTOMY    . TOTAL HIP ARTHROPLASTY Left 01/18/2014   Procedure: LEFT TOTAL HIP ARTHROPLASTY ANTERIOR APPROACH;  Surgeon: Gearlean Alf, MD;  [facility];  Service: Orthopedics;  Laterality: Left;  . TOTAL HIP ARTHROPLASTY Right 09/04/2019   Procedure: RIGHT TOTAL HIP ARTHROPLASTY ANTERIOR APPROACH;  Surgeon: Mcarthur Rossetti, MD;  [facility];  Service: Orthopedics;  Laterality: Right;   Social History   Occupational History  . Occupation: accounting  Tobacco Use  . Smoking status: Never Smoker  . Smokeless tobacco: Never Used  Vaping Use  . Vaping Use: Never used  Substance and Sexual Activity  . Alcohol use: Yes    Comment: OCCASIONAL  . Drug use: No  . Sexual activity: Never

## 2020-05-12 NOTE — Patient Instructions (Signed)
Access Code: [email or web address] URL: [email or web address] Date: 05/12/2020 Prepared by: Vista Mink  Exercises Single Knee to Chest Stretch - 1-2 x daily - 7 x weekly - 1 sets - 5 reps - 20 seconds hold Supine Figure 4 Piriformis Stretch - 1-2 x daily - 7 x weekly - 1 sets - 5 reps - 20 seconds hold Standing Hip Hiking - 2 x daily - 7 x weekly - 2 sets - 10 reps - 3 seconds hold Tandem Stance - 1 x daily - 3 x weekly - 1 sets - 4 reps - 20 second hold Supine Gluteus Stretch - 2 x daily - 7 x weekly - 1 sets - 5 reps - 20 seconds hold Standing Lumbar Extension at Wall - Forearms - 5 x daily - 7 x weekly - 1 sets - 5 reps - 3 seconds hold Prone Hip Extension - 1 x daily - 7 x weekly - 2-3 sets - 10 reps - 3 seconds hold

## 2020-05-16 ENCOUNTER — Encounter: Payer: Self-pay | Admitting: Physical Therapy

## 2020-05-16 ENCOUNTER — Other Ambulatory Visit: Payer: Self-pay

## 2020-05-16 ENCOUNTER — Ambulatory Visit: Payer: Medicare Other | Admitting: Physical Therapy

## 2020-05-16 DIAGNOSIS — M6281 Muscle weakness (generalized): Secondary | ICD-10-CM | POA: Diagnosis not present

## 2020-05-16 DIAGNOSIS — R293 Abnormal posture: Secondary | ICD-10-CM

## 2020-05-16 DIAGNOSIS — R262 Difficulty in walking, not elsewhere classified: Secondary | ICD-10-CM | POA: Diagnosis not present

## 2020-05-16 DIAGNOSIS — M25651 Stiffness of right hip, not elsewhere classified: Secondary | ICD-10-CM | POA: Diagnosis not present

## 2020-05-16 DIAGNOSIS — M25551 Pain in right hip: Secondary | ICD-10-CM

## 2020-05-16 DIAGNOSIS — M5416 Radiculopathy, lumbar region: Secondary | ICD-10-CM

## 2020-05-16 NOTE — Therapy (Signed)
[facility], [place], [address] Phone: [phone]   Fax:  [phone]  Physical Therapy Treatment  Patient Details  Name: Dominique Hawkins MRN: [id number] Date of Birth: 12-18-1940 Referring Provider (PT): Mcarthur Rossetti MD   Encounter Date: 05/16/2020   PT End of Session - 05/16/20 1648    Visit Number 8    Number of Visits 16    Date for PT Re-Evaluation 05/20/20    PT Start Time 0845    PT Stop Time 0925    PT Time Calculation (min) 40 min    Activity Tolerance Patient tolerated treatment well;No increased pain    Behavior During Therapy WFL for tasks assessed/performed           Past Medical History:  Diagnosis Date  . Arthritis   . Cancer ([place])    skin cancer on both legs  . Complication of anesthesia   . Difficulty sleeping    DUE TO PAIN  . GERD (gastroesophageal reflux disease)   . History of blood transfusion   . Hyperlipidemia   . IBS (irritable bowel syndrome)    diarrhea  . Osteoporosis   . Peripheral vascular disease ([place])   . PONV (postoperative nausea and vomiting)   . Prediabetes   . Venous hypertension    varicose veins    Past Surgical History:  Procedure Laterality Date  . ABDOMINAL HYSTERECTOMY    . BLADDER SUSPENSION  2008  . BREAST ENHANCEMENT SURGERY    . BTL    . CHOLECYSTECTOMY    . COLONOSCOPY    . ENDOVENOUS ABLATION SAPHENOUS VEIN W/ LASER  02-08-2011  right greater saphenous vein    and sclerotherapy  right and left legs  . HERNIA REPAIR     hiatal  . ORIF TOE FRACTURE Left 01/06/2020   Procedure: LEFT FOOT FUSION BASE 1ST METATARSAL AND OPEN REDUCTION INTERNAL FIXATION 2ND METATARSAL;  Surgeon: Newt Minion, MD;  Location: [facility];  Service: Orthopedics;  Laterality: Left;  . SHOULDER ARTHROSCOPY Left   . TONSILLECTOMY    . TOTAL HIP ARTHROPLASTY Left 01/18/2014   Procedure: LEFT TOTAL HIP ARTHROPLASTY ANTERIOR APPROACH;  Surgeon: Gearlean Alf, MD;   [facility];  Service: Orthopedics;  Laterality: Left;  . TOTAL HIP ARTHROPLASTY Right 09/04/2019   Procedure: RIGHT TOTAL HIP ARTHROPLASTY ANTERIOR APPROACH;  Surgeon: Mcarthur Rossetti, MD;  [facility];  Service: Orthopedics;  Laterality: Right;    There were no vitals filed for this visit.   Subjective Assessment - 05/16/20 1642    Subjective Kirandeep arriving today reporting 6-7/10 pain in R hip with antalgic gait pattern.    Pertinent History Previously noted R foot surgery, R THA (08/2019) and L THA (2015).    Limitations Sitting;Standing;Walking    How long can you sit comfortably? Standing from prolonged sitting and sitting without lumbar support are painful    How long can you stand comfortably? 10 minutes?    How long can you walk comfortably? < 10 minutes    Patient Stated Goals Sleep, sit, stand and walk without pain.    Currently in Pain? Yes    Pain Score 7     Pain Location Hip    Pain Orientation Right    Pain Descriptors / Indicators Aching;Burning    Pain Type Acute pain    Pain Onset More than a month ago    Pain Frequency Constant  [facility] Adult PT Treatment/Exercise - 05/16/20 0001      Exercises   Exercises Knee/Hip      Knee/Hip Exercises: Stretches   Hip Flexor Stretch Both;5 reps;20 seconds   Single knee to chest with other leg straight   Piriformis Stretch Both;5 reps;20 seconds;Other (comment)    Piriformis Stretch Limitations Figure 4 stretch      Knee/Hip Exercises: Standing   Other Standing Knee Exercises Trunk extension AROM 10X 5-10  second holds   no improvement in symptoms following reps     Knee/Hip Exercises: Prone   Other Prone Exercises Prone alternating hip extensions 2 sets of 10 for 3 seconds      Manual Therapy   Manual therapy comments R hip joint mobilizations,  contract relax muscle energy techniqiue   Pt with good response following manual therapy today reporting less pain in  R hip.                      PT Long Term Goals - 05/16/20 1646      PT LONG TERM GOAL #1   Title Improve FOTO to 61.    Status On-going      PT LONG TERM GOAL #2   Title Improve B hip AROM for flexion to 100 degrees and IR to 0 degrees.    Status Achieved      PT LONG TERM GOAL #3   Title Improve hip flexors and abductors strength to 5/5 MMT.    Status On-going      PT LONG TERM GOAL #4   Title Natallia will be independent with her long-term HEP at DC.    Status On-going      PT LONG TERM GOAL #5   Title Sharmaine will have a negative SLR test at DC.    Baseline Positive on 05/12/2020, Pt with positive SLR on 05/16/2020 for hamstring tightness, no radiculopathy symptoms noted    Status On-going                 Plan - 05/16/20 1650    Clinical Impression Statement Noha arriving today reporting 6-7/10 pain in her R hip. Pt with good response to right hip joint mobs and muscle energy techniques reporting 3-4/10 pain in R hip at end of session. Pt with no response to trunk extension standing exercises. Pt presenting with no radicular pains in R LE. Pt with tightness noted in R hamstrings and IT band with streching. Pt still with pin point pain at greater trochanter.  We discussed DN at next visit and pt was issued a handout. Scot Jun was bought in to palpate R hip and explain dry needling intervention. Continue skilled PT.    Personal Factors and Comorbidities Comorbidity 1    Comorbidities B THA, 12/2019 foot surgery (fracture).    Examination-Activity Limitations Sleep;Sit;Squat;Locomotion Level;Stairs;Stand    Examination-Participation Restrictions Community Activity;Driving    Rehab Potential Good    PT Frequency 2x / week    PT Duration 6 weeks    PT Treatment/Interventions ADLs/Self Care Home Management;Cryotherapy;Therapeutic activities;Stair training;Gait training;Therapeutic exercise;Balance training;Neuromuscular re-education;Patient/family education;Manual  techniques;Dry needling    PT Next Visit Plan Continue hip stretching.  Possibly progress general and hip strength and balance.    PT Home Exercise Plan Access Code: [email or web address]    Consulted and Agree with Plan of Care Patient           Patient will benefit from skilled therapeutic intervention in order to improve the following  deficits and impairments:  Abnormal gait,Decreased activity tolerance,Decreased balance,Decreased endurance,Decreased range of motion,Decreased strength,Difficulty walking,Increased edema,Impaired flexibility,Pain  Visit Diagnosis: Abnormal posture  Difficulty walking  Muscle weakness (generalized)  Stiffness of right hip, not elsewhere classified  Pain in right hip  Radiculopathy, lumbar region     Problem List Patient Active Problem List   Diagnosis Date Noted  . Lisfranc dislocation, left, sequela   . Status post total replacement of right hip 09/04/2019  . Unilateral primary osteoarthritis, right hip 09/03/2019  . 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 Pelvic floor dysfunction in female 07/17/2019  . Chronic diarrhea 05/27/2019  . Irritable bowel syndrome with diarrhea 05/27/2019  . Bile salt-induced diarrhea 05/27/2019  . Bloating symptom 05/27/2019  . History of cholecystectomy 05/27/2019  . History of repair of hiatal hernia 05/27/2019  . History of fundoplication 33/61/2244  . Sensorineural hearing loss (SNHL) of both ears 03/25/2019  . IFG (impaired fasting glucose) 02/04/2019  . Lumbar degenerative disc disease 07/22/2017  . Lumbar spondylosis 07/22/2017  . Epigastric pain 07/04/2017  . Age related osteoporosis 11/24/2015  . Gastroesophageal reflux disease without esophagitis 11/24/2015  . Mixed hyperlipidemia 11/24/2015  . Vitamin D deficiency 11/24/2015  . OA (osteoarthritis) of hip 01/18/2014  . Varicose veins of lower extremities with other complications 97/53/0051    Oretha Caprice, PT, MPT 05/16/2020, 4:59  PM  [facility] [address] [place], [place], [address] Phone: [phone]   Fax:  [phone]  Name: TERA PELLICANE MRN: [id number] Date of Birth: [birth year]

## 2020-05-23 ENCOUNTER — Encounter: Payer: Medicare Other | Admitting: Physical Therapy

## 2020-05-25 ENCOUNTER — Encounter: Payer: Medicare Other | Admitting: Physical Therapy

## 2020-05-30 ENCOUNTER — Encounter: Payer: Self-pay | Admitting: Physical Therapy

## 2020-05-30 ENCOUNTER — Other Ambulatory Visit: Payer: Self-pay

## 2020-05-30 ENCOUNTER — Ambulatory Visit: Payer: Medicare Other | Admitting: Physical Therapy

## 2020-05-30 DIAGNOSIS — M6281 Muscle weakness (generalized): Secondary | ICD-10-CM | POA: Diagnosis not present

## 2020-05-30 DIAGNOSIS — M25551 Pain in right hip: Secondary | ICD-10-CM

## 2020-05-30 DIAGNOSIS — R262 Difficulty in walking, not elsewhere classified: Secondary | ICD-10-CM | POA: Diagnosis not present

## 2020-05-30 DIAGNOSIS — M25651 Stiffness of right hip, not elsewhere classified: Secondary | ICD-10-CM | POA: Diagnosis not present

## 2020-05-30 DIAGNOSIS — R293 Abnormal posture: Secondary | ICD-10-CM

## 2020-05-30 NOTE — Therapy (Signed)
[place], [place], [address] Phone: [phone]   Fax:  [phone]  Physical Therapy Treatment  Patient Details  Name: Dominique Hawkins MRN: [id number] Date of Birth: 1941/03/03 Referring Provider (PT): Mcarthur Rossetti MD   Encounter Date: 05/30/2020   PT End of Session - 05/30/20 0907    Visit Number 9    Number of Visits 16    Date for PT Re-Evaluation 05/20/20    PT Start Time 0849    PT Stop Time 0927    PT Time Calculation (min) 38 min    Activity Tolerance Patient tolerated treatment well;No increased pain    Behavior During Therapy WFL for tasks assessed/performed           Past Medical History:  Diagnosis Date  . Arthritis   . Cancer ([place])    skin cancer on both legs  . Complication of anesthesia   . Difficulty sleeping    DUE TO PAIN  . GERD (gastroesophageal reflux disease)   . History of blood transfusion   . Hyperlipidemia   . IBS (irritable bowel syndrome)    diarrhea  . Osteoporosis   . Peripheral vascular disease ([place])   . PONV (postoperative nausea and vomiting)   . Prediabetes   . Venous hypertension    varicose veins    Past Surgical History:  Procedure Laterality Date  . ABDOMINAL HYSTERECTOMY    . BLADDER SUSPENSION  2008  . BREAST ENHANCEMENT SURGERY    . BTL    . CHOLECYSTECTOMY    . COLONOSCOPY    . ENDOVENOUS ABLATION SAPHENOUS VEIN W/ LASER  02-08-2011  right greater saphenous vein    and sclerotherapy  right and left legs  . HERNIA REPAIR     hiatal  . ORIF TOE FRACTURE Left 01/06/2020   Procedure: LEFT FOOT FUSION BASE 1ST METATARSAL AND OPEN REDUCTION INTERNAL FIXATION 2ND METATARSAL;  Surgeon: Newt Minion, MD;  Location: [facility];  Service: Orthopedics;  Laterality: Left;  . SHOULDER ARTHROSCOPY Left   . TONSILLECTOMY    . TOTAL HIP ARTHROPLASTY Left 01/18/2014   Procedure: LEFT TOTAL HIP ARTHROPLASTY ANTERIOR APPROACH;  Surgeon: Gearlean Alf, MD;  Location:  [facility];  Service: Orthopedics;  Laterality: Left;  . TOTAL HIP ARTHROPLASTY Right 09/04/2019   Procedure: RIGHT TOTAL HIP ARTHROPLASTY ANTERIOR APPROACH;  Surgeon: Mcarthur Rossetti, MD;  Location: [facility];  Service: Orthopedics;  Laterality: Right;    There were no vitals filed for this visit.   Subjective Assessment - 05/30/20 0902    Subjective Pt arriving from her trip to [place] where she did a lot of walking. Pt reporting 4/10 pain in R hip today upon arrival.    Pertinent History Previously noted R foot surgery, R THA (08/2019) and L THA (2015).    Limitations Sitting;Standing;Walking    How long can you sit comfortably? Standing from prolonged sitting and sitting without lumbar support are painful    How long can you stand comfortably? 10 minutes?    How long can you walk comfortably? < 10 minutes    Diagnostic tests Positive SLR R    Patient Stated Goals Sleep, sit, stand and walk without pain.    Currently in Pain? Yes    Pain Score 4     Pain Location Hip    Pain Orientation Right    Pain Descriptors / Indicators Aching;Burning  [facility] Adult PT Treatment/Exercise - 05/30/20 0001      Exercises   Exercises Knee/Hip      Knee/Hip Exercises: Stretches   Hip Flexor Stretch Both;5 reps;20 seconds   Single knee to chest with other leg straight   Hip Flexor Stretch Limitations R leg extended off edge of table x 2    Piriformis Stretch Both;5 reps;20 seconds;Other (comment)    Piriformis Stretch Limitations Figure 4 stretch      Knee/Hip Exercises: Prone   Other Prone Exercises Prone alternating hip extensions 2 sets of 10 for 3 seconds    Other Prone Exercises Reverse clams 2x10      Manual Therapy   Manual therapy comments R hip joint mobilizations,  contract relax muscle energy techniqiue, R long axis distraction x 3 holding 20-30 seconds each   less pain reported following manual therapy                 PT  Education - 05/30/20 0905    Education Details discussed DN and pt feels like she is not ready for that at this time.    Person(s) Educated Patient    Methods Explanation    Comprehension Verbalized understanding               PT Long Term Goals - 05/30/20 0925      PT LONG TERM GOAL #1   Title Improve FOTO to 61.    Status On-going      PT LONG TERM GOAL #2   Title Improve B hip AROM for flexion to 100 degrees and IR to 0 degrees.    Status On-going      PT LONG TERM GOAL #3   Title Improve hip flexors and abductors strength to 5/5 MMT.    Status On-going      PT LONG TERM GOAL #4   Title Dominique Hawkins will be independent with her long-term HEP at [facility].    Status On-going      PT LONG TERM GOAL #5   Title Dominique Hawkins will have a negative SLR test at [facility].    Baseline Positive on 05/12/2020, Pt with positive SLR on 05/16/2020 for hamstring tightness, no radiculopathy symptoms noted    Status On-going                 Plan - 05/30/20 0908    Clinical Impression Statement Pt reporting she wishes to hold off on DN at this time. Pt arriving to therapy reporting 4/10 pain R hip. Pt also reporting less burning sensation in her hip. No radiculopathy pain noted. Pt with good response to muscle energy techniques (contract/relax) aong with R LE long axis distraction and hip mobs. Pt still with tightenss noted in R iliopsoas and R IT band. Continue to progress as tolerated.    Personal Factors and Comorbidities Comorbidity 1    Comorbidities B THA, 12/2019 foot surgery (fracture).    Examination-Activity Limitations Sleep;Sit;Squat;Locomotion Level;Stairs;Stand    Examination-Participation Restrictions Community Activity;Driving    Stability/Clinical Decision Making Stable/Uncomplicated    Rehab Potential Good    PT Frequency 2x / week    PT Duration 6 weeks    PT Treatment/Interventions ADLs/Self Care Home Management;Cryotherapy;Therapeutic activities;Stair training;Gait training;Therapeutic  exercise;Balance training;Neuromuscular re-education;Patient/family education;Manual techniques;Dry needling    PT Next Visit Plan Continue hip stretching.  Possibly progress general and hip strength and balance.    PT Home Exercise Plan Access Code: [email or web address]    Consulted and Agree with Plan of Care  Patient           Patient will benefit from skilled therapeutic intervention in order to improve the following deficits and impairments:  Abnormal gait,Decreased activity tolerance,Decreased balance,Decreased endurance,Decreased range of motion,Decreased strength,Difficulty walking,Increased edema,Impaired flexibility,Pain  Visit Diagnosis: Difficulty walking  Muscle weakness (generalized)  Stiffness of right hip, not elsewhere classified  Pain in right hip  Abnormal posture     Problem List Patient Active Problem List   Diagnosis Date Noted  . Lisfranc dislocation, left, sequela   . Status post total replacement of right hip 09/04/2019  . Unilateral primary osteoarthritis, right hip 09/03/2019  . 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 Pelvic floor dysfunction in female 07/17/2019  . Chronic diarrhea 05/27/2019  . Irritable bowel syndrome with diarrhea 05/27/2019  . Bile salt-induced diarrhea 05/27/2019  . Bloating symptom 05/27/2019  . History of cholecystectomy 05/27/2019  . History of repair of hiatal hernia 05/27/2019  . History of fundoplication 17/51/0258  . Sensorineural hearing loss (SNHL) of both ears 03/25/2019  . IFG (impaired fasting glucose) 02/04/2019  . Lumbar degenerative disc disease 07/22/2017  . Lumbar spondylosis 07/22/2017  . Epigastric pain 07/04/2017  . Age related osteoporosis 11/24/2015  . Gastroesophageal reflux disease without esophagitis 11/24/2015  . Mixed hyperlipidemia 11/24/2015  . Vitamin D deficiency 11/24/2015  . OA (osteoarthritis) of hip 01/18/2014  . Varicose veins of lower extremities with other complications  52/77/8242    Oretha Caprice, PT, MPT 05/30/2020, 9:28 AM  [facility] Physical Therapy [address] [place], [place], [address] Phone: [phone]   Fax:  [phone]  Name: Dominique Hawkins MRN: [id number] Date of Birth: [birth year]

## 2020-06-01 ENCOUNTER — Other Ambulatory Visit: Payer: Self-pay

## 2020-06-01 ENCOUNTER — Encounter: Payer: Self-pay | Admitting: Physical Therapy

## 2020-06-01 ENCOUNTER — Ambulatory Visit: Payer: Medicare Other | Admitting: Physical Therapy

## 2020-06-01 DIAGNOSIS — R262 Difficulty in walking, not elsewhere classified: Secondary | ICD-10-CM | POA: Diagnosis not present

## 2020-06-01 DIAGNOSIS — R293 Abnormal posture: Secondary | ICD-10-CM

## 2020-06-01 DIAGNOSIS — M25651 Stiffness of right hip, not elsewhere classified: Secondary | ICD-10-CM | POA: Diagnosis not present

## 2020-06-01 DIAGNOSIS — M25551 Pain in right hip: Secondary | ICD-10-CM

## 2020-06-01 DIAGNOSIS — M6281 Muscle weakness (generalized): Secondary | ICD-10-CM

## 2020-06-01 NOTE — Therapy (Addendum)
[place], [place], [address] Phone: [phone]   Fax:  [phone]  Physical Therapy Treatment Progress Note Discharge  Patient Details  Name: Dominique Hawkins MRN: [id number] Date of Birth: 06/08/1940 Referring Provider (PT): Dominique Rosenthal MD   Encounter Date: 06/01/2020   PT End of Session - 06/01/20 0859    Visit Number 10    Number of Visits 16    Date for PT Re-Evaluation 05/20/20    PT Start Time 0850    PT Stop Time 0930    PT Time Calculation (min) 40 min    Activity Tolerance Patient tolerated treatment well;No increased pain    Behavior During Therapy WFL for tasks assessed/performed           Past Medical History:  Diagnosis Date  . Arthritis   . Cancer ([place])    skin cancer on both legs  . Complication of anesthesia   . Difficulty sleeping    DUE TO PAIN  . GERD (gastroesophageal reflux disease)   . History of blood transfusion   . Hyperlipidemia   . IBS (irritable bowel syndrome)    diarrhea  . Osteoporosis   . Peripheral vascular disease ([place])   . PONV (postoperative nausea and vomiting)   . Prediabetes   . Venous hypertension    varicose veins    Past Surgical History:  Procedure Laterality Date  . ABDOMINAL HYSTERECTOMY    . BLADDER SUSPENSION  2008  . BREAST ENHANCEMENT SURGERY    . BTL    . CHOLECYSTECTOMY    . COLONOSCOPY    . ENDOVENOUS ABLATION SAPHENOUS VEIN W/ LASER  02-08-2011  right greater saphenous vein    and sclerotherapy  right and left legs  . HERNIA REPAIR     hiatal  . ORIF TOE FRACTURE Left 01/06/2020   Procedure: LEFT FOOT FUSION BASE 1ST METATARSAL AND OPEN REDUCTION INTERNAL FIXATION 2ND METATARSAL;  Surgeon: Newt Minion, MD;  Location: [facility];  Service: Orthopedics;  Laterality: Left;  . SHOULDER ARTHROSCOPY Left   . TONSILLECTOMY    . TOTAL HIP ARTHROPLASTY Left 01/18/2014   Procedure: LEFT TOTAL HIP ARTHROPLASTY ANTERIOR APPROACH;  Surgeon: Gearlean Alf, MD;  Location: [facility];  Service: Orthopedics;  Laterality: Left;  . TOTAL HIP ARTHROPLASTY Right 09/04/2019   Procedure: RIGHT TOTAL HIP ARTHROPLASTY ANTERIOR APPROACH;  Surgeon: Mcarthur Rossetti, MD;  Location: [facility];  Service: Orthopedics;  Laterality: Right;    There were no vitals filed for this visit.   Subjective Assessment - 06/01/20 0854    Subjective Pt arrving to therapy today reporting 1/10 pain in her R hip. Pt woke up this morning with "sore" back. Pt reporting she feels like it is related to the rainy weather. No radiation reported into either leg.    Pertinent History Previously noted R foot surgery, R THA (08/2019) and L THA (2015).    How long can you sit comfortably? Standing from prolonged sitting and sitting without lumbar support are painful    How long can you stand comfortably? 10 minutes?    How long can you walk comfortably? < 10 minutes    Diagnostic tests Positive SLR R    Patient Stated Goals Sleep, sit, stand and walk without pain.    Currently in Pain? Yes    Pain Score 1     Pain Orientation Right    Pain Descriptors / Indicators Aching    Pain Type Acute  pain    Pain Onset More than a month ago              [facility] PT Assessment - 06/01/20 0001      Assessment   Medical Diagnosis R hip bursitis    Referring Provider (PT) Dominique Rosenthal MD      AROM   Overall AROM  Deficits    AROM Assessment Site Hip    Right/Left Hip Right    Right Hip Flexion 112   PROM: 115   Right Hip External Rotation  40    Right Hip Internal Rotation  25      Strength   Overall Strength Deficits    Strength Assessment Site Hip    Right/Left Hip Right    Right Hip Flexion 4+/5    Right Hip ABduction 4+/5      Flexibility   Hamstrings PROM: 90 R hamstring in supine with opposite leg straight                         [facility] Adult PT Treatment/Exercise - 06/01/20 0001      Knee/Hip Exercises: Stretches   Hip Flexor Stretch  Both;5 reps;20 seconds   Single knee to chest with other leg straight   Hip Flexor Stretch Limitations R leg extended off edge of table x 2    Piriformis Stretch Both;5 reps;20 seconds;Other (comment)    Piriformis Stretch Limitations Figure 4 stretch      Knee/Hip Exercises: Supine   Other Supine Knee/Hip Exercises trunk rotation x 3 to each side holding 20 seconds each    Other Supine Knee/Hip Exercises marching with core activation x 20 reps      Knee/Hip Exercises: Sidelying   Other Sidelying Knee/Hip Exercises Reverse clams x 20      Knee/Hip Exercises: Prone   Other Prone Exercises Prone alternating hip extensions 2 sets of 10 for 3 seconds      Manual Therapy   Manual therapy comments R hip mobs, contract relax x 4                       PT Long Term Goals - 06/01/20 0926      PT LONG TERM GOAL #1   Title Improve FOTO to 61.    Baseline 50 (was 44 at evaluation and 54 last visit)    Status On-going      PT LONG TERM GOAL #2   Title Improve B hip AROM for flexion to 100 degrees and IR to 0 degrees.    Baseline 112 on 06/01/3020    Time 8    Period Weeks    Status Achieved      PT LONG TERM GOAL #3   Title Improve hip flexors and abductors strength to 5/5 MMT.    Baseline Improved from 4-/5 to 4+/5 MMT    Status On-going      PT LONG TERM GOAL #4   Title [name] will be independent with her long-term HEP at [place].    Status On-going      PT LONG TERM GOAL #5   Baseline Negative SLR on 06/01/2020 with hamstring PROM to 90 degrees with no radiculopathy symptoms.    Time 6    Period Weeks    Status Achieved                 Plan - 06/01/20 0928    Clinical Impression Statement Pt  has met 2 out of 5 of her LTG's set at her initial evaluation. Pt has made excellend progress with hip AROM and strength over the last 2 weeks. Pt with no radiculopathy symptoms noted in R LE with SLR. Pt making great progress using hip mobilizations and muscle energy techniques.  Over the last 2 weeks pt's pain has decreased significantly in her R hip. Pt reporting today Dr. Ninfa Linden cleared her. Pt to continue to PT to progress toward LTG's not met.    Personal Factors and Comorbidities Comorbidity 1    Comorbidities B THA, 12/2019 foot surgery (fracture).    Examination-Activity Limitations Sleep;Sit;Squat;Locomotion Level;Stairs;Stand    Examination-Participation Restrictions Community Activity;Driving    Stability/Clinical Decision Making Stable/Uncomplicated    Rehab Potential Good    PT Frequency 2x / week    PT Duration 6 weeks    PT Treatment/Interventions ADLs/Self Care Home Management;Cryotherapy;Therapeutic activities;Stair training;Gait training;Therapeutic exercise;Balance training;Neuromuscular re-education;Patient/family education;Manual techniques;Dry needling    PT Next Visit Plan Continue joint mobs and muscle energy techniques, LE strengthening and stretching    PT Home Exercise Plan Access Code: [email or web address]    Consulted and Agree with Plan of Care Patient           Patient will benefit from skilled therapeutic intervention in order to improve the following deficits and impairments:  Abnormal gait,Decreased activity tolerance,Decreased balance,Decreased endurance,Decreased range of motion,Decreased strength,Difficulty walking,Increased edema,Impaired flexibility,Pain  Visit Diagnosis: Difficulty walking  Muscle weakness (generalized)  Stiffness of right hip, not elsewhere classified  Pain in right hip  Abnormal posture     Problem List Patient Active Problem List   Diagnosis Date Noted  . Lisfranc dislocation, left, sequela   . Status post total replacement of right hip 09/04/2019  . Unilateral primary osteoarthritis, right hip 09/03/2019  . 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 Pelvic floor dysfunction in female 07/17/2019  . Chronic diarrhea 05/27/2019  . Irritable bowel syndrome with diarrhea 05/27/2019  . Bile  salt-induced diarrhea 05/27/2019  . Bloating symptom 05/27/2019  . History of cholecystectomy 05/27/2019  . History of repair of hiatal hernia 05/27/2019  . History of fundoplication 20/81/3887  . Sensorineural hearing loss (SNHL) of both ears 03/25/2019  . IFG (impaired fasting glucose) 02/04/2019  . Lumbar degenerative disc disease 07/22/2017  . Lumbar spondylosis 07/22/2017  . Epigastric pain 07/04/2017  . Age related osteoporosis 11/24/2015  . Gastroesophageal reflux disease without esophagitis 11/24/2015  . Mixed hyperlipidemia 11/24/2015  . Vitamin D deficiency 11/24/2015  . OA (osteoarthritis) of hip 01/18/2014  . Varicose veins of lower extremities with other complications 19/59/7471  PHYSICAL THERAPY DISCHARGE SUMMARY  Visits from Start of Care: 10 of 16  Current functional level related to goals / functional outcomes: See above   Remaining deficits: See above    Education / Equipment: See above Plan: Patient agrees to discharge.  Patient goals were not met. Patient is being discharged due to not returning since the last visit.  ?????       Oretha Caprice, PT, MPT 06/01/2020, 9:32 AM   [facility] Physical Therapy [address] [place], [place], [address] Phone: [phone]   Fax:  [phone]  Name: Dominique Hawkins MRN: [id number] Date of Birth: 1940/04/05

## 2020-06-10 ENCOUNTER — Encounter: Payer: Medicare Other | Admitting: Rehabilitative and Restorative Service Providers"

## 2020-06-16 ENCOUNTER — Encounter: Payer: Medicare Other | Admitting: Rehabilitative and Restorative Service Providers"

## 2020-10-24 ENCOUNTER — Other Ambulatory Visit: Payer: Self-pay | Admitting: Physician Assistant

## 2021-07-14 ENCOUNTER — Other Ambulatory Visit: Payer: Self-pay | Admitting: Physician Assistant

## 2022-04-05 ENCOUNTER — Other Ambulatory Visit: Payer: Self-pay | Admitting: Rehabilitation

## 2022-04-05 DIAGNOSIS — M5416 Radiculopathy, lumbar region: Secondary | ICD-10-CM

## 2022-04-20 ENCOUNTER — Ambulatory Visit
Admission: RE | Admit: 2022-04-20 | Discharge: 2022-04-20 | Disposition: A | Discharge status: Home / Self Care | Payer: Medicare Other | Source: Ambulatory Visit | Attending: Rehabilitation | Admitting: Rehabilitation

## 2022-04-20 DIAGNOSIS — M5416 Radiculopathy, lumbar region: Secondary | ICD-10-CM

## 2023-10-29 ENCOUNTER — Other Ambulatory Visit: Payer: Self-pay | Admitting: Medical Genetics

## 2023-11-19 ENCOUNTER — Other Ambulatory Visit (HOSPITAL_COMMUNITY)
Admission: RE | Admit: 2023-11-19 | Discharge: 2023-11-19 | Disposition: A | Discharge status: Home / Self Care | Payer: Self-pay | Source: Ambulatory Visit | Attending: Medical Genetics | Admitting: Medical Genetics

## 2023-11-26 LAB — GENECONNECT MOLECULAR SCREEN: Genetic Analysis Overall Interpretation: NEGATIVE
